# Patient Record
Sex: Female | Born: 1950
Health system: Southern US, Community
[De-identification: ages and names within clinical notes are randomized; demographics above are authoritative.]

## PROBLEM LIST (undated history)

## (undated) DIAGNOSIS — I739 Peripheral vascular disease, unspecified: Secondary | ICD-10-CM

## (undated) DIAGNOSIS — I10 Essential (primary) hypertension: Secondary | ICD-10-CM

## (undated) DIAGNOSIS — E785 Hyperlipidemia, unspecified: Secondary | ICD-10-CM

## (undated) HISTORY — PX: OTHER SURGICAL HISTORY: SHX169

## (undated) HISTORY — DX: Peripheral vascular disease, unspecified: I73.9

## (undated) HISTORY — PX: PARTIAL HYSTERECTOMY: SHX80

## (undated) HISTORY — DX: Hyperlipidemia, unspecified: E78.5

---

## 1999-11-05 ENCOUNTER — Encounter: Payer: Self-pay | Admitting: Internal Medicine

## 1999-11-05 ENCOUNTER — Encounter: Admission: RE | Admit: 1999-11-05 | Discharge: 1999-11-05 | Payer: Self-pay | Admitting: Internal Medicine

## 2000-01-04 ENCOUNTER — Emergency Department (HOSPITAL_COMMUNITY): Admission: EM | Admit: 2000-01-04 | Discharge: 2000-01-04 | Payer: Self-pay | Admitting: Emergency Medicine

## 2000-11-22 ENCOUNTER — Encounter: Admission: RE | Admit: 2000-11-22 | Discharge: 2000-11-22 | Payer: Self-pay | Admitting: Internal Medicine

## 2000-11-22 ENCOUNTER — Encounter: Payer: Self-pay | Admitting: Internal Medicine

## 2002-03-15 ENCOUNTER — Encounter: Payer: Self-pay | Admitting: Internal Medicine

## 2002-03-15 ENCOUNTER — Encounter: Admission: RE | Admit: 2002-03-15 | Discharge: 2002-03-15 | Payer: Self-pay | Admitting: Internal Medicine

## 2003-02-21 ENCOUNTER — Encounter: Payer: Self-pay | Admitting: Internal Medicine

## 2003-02-21 ENCOUNTER — Encounter: Admission: RE | Admit: 2003-02-21 | Discharge: 2003-02-21 | Payer: Self-pay | Admitting: Internal Medicine

## 2003-06-11 ENCOUNTER — Ambulatory Visit (HOSPITAL_COMMUNITY): Admission: RE | Admit: 2003-06-11 | Discharge: 2003-06-11 | Payer: Self-pay | Admitting: Gastroenterology

## 2004-02-25 ENCOUNTER — Ambulatory Visit (HOSPITAL_COMMUNITY): Admission: RE | Admit: 2004-02-25 | Discharge: 2004-02-25 | Payer: Self-pay | Admitting: Internal Medicine

## 2004-02-25 ENCOUNTER — Encounter: Admission: RE | Admit: 2004-02-25 | Discharge: 2004-02-25 | Payer: Self-pay | Admitting: Internal Medicine

## 2005-04-21 ENCOUNTER — Ambulatory Visit (HOSPITAL_COMMUNITY): Admission: RE | Admit: 2005-04-21 | Discharge: 2005-04-21 | Payer: Self-pay | Admitting: Internal Medicine

## 2005-06-08 ENCOUNTER — Ambulatory Visit (HOSPITAL_COMMUNITY): Admission: RE | Admit: 2005-06-08 | Discharge: 2005-06-08 | Payer: Self-pay | Admitting: Vascular Surgery

## 2005-06-22 ENCOUNTER — Ambulatory Visit (HOSPITAL_COMMUNITY): Admission: RE | Admit: 2005-06-22 | Discharge: 2005-06-22 | Payer: Self-pay | Admitting: Vascular Surgery

## 2006-04-23 ENCOUNTER — Ambulatory Visit (HOSPITAL_COMMUNITY): Admission: RE | Admit: 2006-04-23 | Discharge: 2006-04-23 | Payer: Self-pay | Admitting: Internal Medicine

## 2006-10-13 ENCOUNTER — Ambulatory Visit: Payer: Self-pay | Admitting: Vascular Surgery

## 2007-04-26 ENCOUNTER — Ambulatory Visit (HOSPITAL_COMMUNITY): Admission: RE | Admit: 2007-04-26 | Discharge: 2007-04-26 | Payer: Self-pay | Admitting: Internal Medicine

## 2007-05-02 ENCOUNTER — Encounter: Admission: RE | Admit: 2007-05-02 | Discharge: 2007-05-02 | Payer: Self-pay | Admitting: Internal Medicine

## 2008-04-17 ENCOUNTER — Ambulatory Visit: Payer: Self-pay | Admitting: Vascular Surgery

## 2008-05-03 ENCOUNTER — Ambulatory Visit (HOSPITAL_COMMUNITY): Admission: RE | Admit: 2008-05-03 | Discharge: 2008-05-03 | Payer: Self-pay | Admitting: Internal Medicine

## 2008-05-07 ENCOUNTER — Ambulatory Visit: Payer: Self-pay | Admitting: Vascular Surgery

## 2008-05-07 ENCOUNTER — Ambulatory Visit (HOSPITAL_COMMUNITY): Admission: RE | Admit: 2008-05-07 | Discharge: 2008-05-07 | Payer: Self-pay | Admitting: Vascular Surgery

## 2008-05-07 HISTORY — PX: OTHER SURGICAL HISTORY: SHX169

## 2008-08-27 ENCOUNTER — Emergency Department (HOSPITAL_COMMUNITY): Admission: EM | Admit: 2008-08-27 | Discharge: 2008-08-27 | Payer: Self-pay | Admitting: Emergency Medicine

## 2008-12-27 ENCOUNTER — Ambulatory Visit: Payer: Self-pay | Admitting: Vascular Surgery

## 2009-02-07 ENCOUNTER — Ambulatory Visit: Payer: Self-pay | Admitting: Vascular Surgery

## 2009-05-07 ENCOUNTER — Ambulatory Visit (HOSPITAL_COMMUNITY): Admission: RE | Admit: 2009-05-07 | Discharge: 2009-05-07 | Payer: Self-pay | Admitting: Internal Medicine

## 2010-02-26 ENCOUNTER — Ambulatory Visit: Payer: Self-pay | Admitting: Vascular Surgery

## 2010-06-14 ENCOUNTER — Encounter: Payer: Self-pay | Admitting: Internal Medicine

## 2010-06-17 ENCOUNTER — Other Ambulatory Visit (HOSPITAL_COMMUNITY): Payer: Self-pay | Admitting: Internal Medicine

## 2010-06-17 DIAGNOSIS — Z1239 Encounter for other screening for malignant neoplasm of breast: Secondary | ICD-10-CM

## 2010-06-17 DIAGNOSIS — Z1231 Encounter for screening mammogram for malignant neoplasm of breast: Secondary | ICD-10-CM

## 2010-07-08 ENCOUNTER — Ambulatory Visit (HOSPITAL_COMMUNITY): Payer: Self-pay

## 2010-07-10 ENCOUNTER — Ambulatory Visit (HOSPITAL_COMMUNITY)
Admission: RE | Admit: 2010-07-10 | Discharge: 2010-07-10 | Disposition: A | Discharge status: Home / Self Care | Payer: BC Managed Care – PPO | Source: Ambulatory Visit | Attending: Internal Medicine | Admitting: Internal Medicine

## 2010-07-10 DIAGNOSIS — Z1231 Encounter for screening mammogram for malignant neoplasm of breast: Secondary | ICD-10-CM

## 2010-07-30 ENCOUNTER — Ambulatory Visit (INDEPENDENT_AMBULATORY_CARE_PROVIDER_SITE_OTHER): Payer: BC Managed Care – PPO | Admitting: Vascular Surgery

## 2010-07-30 DIAGNOSIS — I70219 Atherosclerosis of native arteries of extremities with intermittent claudication, unspecified extremity: Secondary | ICD-10-CM

## 2010-07-31 NOTE — Assessment & Plan Note (Signed)
OFFICE VISIT  Heather Hart, Heather Hart DOB:  04-23-1951                                       07/30/2010 [id number]#:[id number]  I saw the patient in the office today for continued follow-up of her peripheral vascular disease.  This is a pleasant 60 year old woman who underwent PTA and stenting of the right common iliac artery in December of 2009.  She also has a known  left common iliac artery occlusion.  I last saw her in September of 2010 at which time, her stent was widely patent and she was doing well.  We planned on seeing her back in a year; however, she was then lost to follow-up.  She denies any claudication or rest pain in the right leg.  She has had some stable claudication in the left thigh which has not changed over the last year.  She has had no rest pain on the left and no history of nonhealing ulcers.  Her symptoms in the left thigh are brought on by ambulation and relieved with rest. There are no other aggravating or alleviating factors.  SOCIAL HISTORY:  She does not use tobacco.  REVIEW OF SYSTEMS:  CARDIOVASCULAR:  She had no chest pain, chest pressure, palpitations or arrhythmias. PULMONARY:  She had no productive cough,  bronchitis, asthma or wheezing.  PHYSICAL EXAMINATION:  This is a pleasant 60 year old woman who appears her stated age.  Blood pressure 154/88, heart rate is 76, saturation 99%.  Lungs:  Clear bilaterally to auscultation.  Cardiovascular exam: I do not detect any carotid bruits.  She has a regular rate and rhythm. It is somewhat difficult to palpate her right femoral pulse because of her size.  I cannot palpate a left femoral pulse.  She does have a palpable posterior tibial pulse in the right foot.  I cannot palpate pedal pulses on the left foot.  Abdomen:  Soft and nontender with normal pitched bowel sounds.  Neurologic:  Shows no focal weakness or paresthesias.  I did independently interpret her arterial Doppler study  today which shows an ABI of 87% on the right and 66% on the left.  I have compared this to her previous study from September 2010 at which time she was 99% on the right and 58% on the left.  I did also interpret her duplex of her stent which was also done on 02/26/2010 and shows that her right common iliac artery stent is widely patent with no areas of increased velocity.  Her drop in ABI is likely related to some infrainguinal arterial occlusive disease.  She remains asymptomatic on the right side  and I think her stent on the right is patent.  She tries to ambulate every day and she is not a smoker.  She had been on Plavix but  this has subsequently been discontinued which I think is reasonable given that the stent has been in since 2009.  I have encouraged to stay as active as possible.  I have ordered follow-up studies in 1 year.  I will plan on seeing her back in 2 years unless there is any  change in her follow-up studies or if she develops any new symptoms.    Di Kindle. Edilia Bo, M.D. Electronically Signed  CSD/MEDQ  D:  07/30/2010  T:  07/31/2010  Job:  [id number]  cc:   Theressa Millard, M.D.

## 2010-10-07 NOTE — Op Note (Signed)
Heather Hart, Heather Hart             ACCOUNT NO.:  [id number]   MEDICAL RECORD NO.:  [id number]          PATIENT TYPE:  AMB   LOCATION:                               FACILITY:  [facility]   PHYSICIAN:  Di Kindle. Edilia Bo, M.D.DATE OF BIRTH:  06-11-1950   DATE OF PROCEDURE:  05/07/2008  DATE OF DISCHARGE:                               OPERATIVE REPORT   PREOPERATIVE DIAGNOSIS:  Bilateral iliac artery occlusive disease.   POSTOPERATIVE DIAGNOSIS:  Bilateral iliac artery occlusive disease.   PROCEDURE:  1. Ultrasound-guided access to the right common femoral artery.  2. Aortogram with bilateral runoff.  3. Percutaneous transluminal angioplasty and stent to the right common      iliac artery, a Genesis [id number] stent.   DESCRIPTION OF PROCEDURE:  The patient was taken to the PV Lab and  sedated with a milligram of Versed and 50 mcg of fentanyl IV.  Both  groins were prepped and draped in the usual sterile fashion.  After the  skin was infiltrated with 1% lidocaine and under ultrasound guidance,  the right common femoral artery was cannulated and a guidewire  introduced into the infrarenal aorta under fluoroscopic control.  The 5-  French sheath was introduced over the wire.  A pigtail catheter was  positioned at the L1 vertebral body and fresh aortogram obtained.  The  catheter was then repositioned above the aortic bifurcation and oblique  iliac projections were obtained.  There was an approximately 85%  irregular plaque in the proximal right common iliac artery.  The left  common iliac artery had a long segment occlusion reconstitution via the  hypogastric, via left external iliac artery.  I elected to address the  right iliac artery stenosis in anticipation of possible right-to-left  fem-fem bypass grafting.  The 5-French sheath was exchanged for a long 6-  French sheath and the patient then received 4000 units of IV heparin.  The sheath was then advanced through the stenosis and then  a Genesis [id number]  [id number] stent was positioned across the stenosis and the sheath retracted.  The stent was deployed.  I did a poststenotic dilatation with a 9-mm  balloon after completion film after the initial stent showed some mild  residual stenosis.  At the completion, there was an excellent result  with no residual stenosis.  The gradient was measured across this area  and there was no resting gradient.  Bilateral lower extremity runoff  films were then obtained.   FINDINGS:  There are single renal arteries bilaterally with no  significant renal artery stenosis identified.  The infrarenal aorta is  widely patent.  On the left side, the common iliac artery is occluded at  its origin.  There is reconstitution in the external iliac artery via  hypogastric artery collaterals.  The common femoral, deep femoral,  superficial femoral, popliteal and tibial vessels are patent on the  left.  This 3-vessel runoff on the left via the anterior tibial,  posterior tibial, and peroneal arteries.   On the right side, there is a proximal 85% irregular plaque in the  common iliac artery.  This was successfully ballooned and stented as  described above.  Distally, the common iliac, hypogastric, and external  iliac arteries are patent on the right.  On the right, the common  femoral, deep femoral, superficial femoral, popliteal and tibial vessels  were all patent on the right with three-vessel runoff on the right via  the anterior tibial, posterior tibial, and peroneal arteries.   CONCLUSIONS:  1. Long segment occlusion of left common iliac artery.  2. 85% stenosis in the proximal right common iliac artery, which was      successfully ballooned and stented.  3. Minimal infrainguinal arterial occlusive disease.      Di Kindle. Edilia Bo, M.D.  Electronically Signed     CSD/MEDQ  D:  05/07/2008  T:  05/07/2008  Job:  [id number]   cc:   Theressa Millard, M.D.

## 2010-10-07 NOTE — Assessment & Plan Note (Signed)
OFFICE VISIT   Heather Hart, Heather Hart  DOB:  1951/03/12                                       04/17/2008  XBMWU#:[id number]   I saw the patient in the office today for continued followup of her  iliac artery occlusive disease.  This is a pleasant 60 year old woman  who I had last seen in May of 2008 with left lower extremity  claudication.  She apparently had undergone an arteriogram in January of  2007 which showed an occlusion of the proximal left common iliac artery  and reconstitution of the external iliac artery via hypogastric  collaterals.  She had mild iliac disease on the right.   Since I saw her last her left lower extremity claudication has gradually  progressed.  She experiences claudication in the left thigh and hip  associated with ambulation and relieved with rest.  This occurs at a  fairly short distance of approximately 10 yards.  She has had no  significant right lower extremity symptoms.   Of note, she has gained weight and she thinks some of her progression of  symptoms may be related to this.  She has had no rest pain and no  history of nonhealing ulcers.   PAST MEDICAL HISTORY:  1. Her past medical history is significant for obesity.  2. Hypertension.  3. Hypercholesterolemia.  4. She denies any history of diabetes, history of previous myocardial      infarction, history of congestive heart failure or history of COPD.   FAMILY HISTORY:  She had a brother who had a myocardial infarction in  his 89s.  She is unaware of any other history of premature  cardiovascular disease.   SOCIAL HISTORY:  She is single.  She has two children.  She quit tobacco  10 years ago.   REVIEW OF SYSTEMS AND MEDICATIONS:  Are documented on the medical  history form in her chart.   PHYSICAL EXAMINATION:  General:  On physical examination this is a  pleasant 60 year old woman who appears her stated age.  She is  moderately obese.  Vital signs:  Her blood  pressure is 162/87, heart  rate is 84.  Neck:  I do not detect any carotid bruits.  Lungs:  Are  clear bilaterally to auscultation.  Cardiac:  She has a regular rate and  rhythm.  Abdomen:  Obese and difficult to assess.  She has a palpable  right femoral pulse.  I cannot palpate a left femoral pulse.  I cannot  palpate pedal pulses although both feet appear adequately perfused  without ischemic ulcers.  She has no significant lower extremity  swelling.   I have discussed the option of continued conservative treatment with a  structured walking program and I have explained that I would favor this  approach if her symptoms are tolerable.  Certainly there is significant  increased risk of surgery given her obesity.  We will angioplasty if  possible.  We have discussed the indications for arteriography and the  potential complications including but not limited to renal  insufficiency, arterial injury and bleeding.  We have also discussed the  potential complications of iliac angioplasty including but not limited  to bleeding, arterial thrombosis and arterial injury.  All of her  questions were answered and she is agreeable to proceed.  Her procedure  has been scheduled  for 05/07/2008.  Of note, she is currently not on  aspirin and I have asked her to begin taking enteric coated aspirin  daily.  If she undergoes iliac angioplasty we will put her on Plavix.  My thought is that if the right iliac stenosis has progressed she could  potentially require right iliac artery angioplasty in anticipation of a  right to left fem-fem bypass graft.  There is a small chance that the  left common iliac artery occlusion could potentially be addressed from  an endovascular standpoint although I do not think this was an ideal  lesion on her previous study.   Di Kindle. Edilia Bo, M.D.  Electronically Signed   CSD/MEDQ  D:  04/17/2008  T:  04/18/2008  Job:  [id number]

## 2010-10-07 NOTE — Assessment & Plan Note (Signed)
OFFICE VISIT   Heather Hart, [name]  DOB:  [birth year]                                       02/07/2009  YNWGN#:[id number]   I saw the patient in the office today for continued followup of her  iliac artery occlusive disease.  She has a known occlusion of the  proximal left common iliac artery and most recently she underwent PTA  and stenting of the right common iliac artery stenosis in December of  2009.  She comes in for a routine followup visit.  She states that the  claudication symptoms in her right leg have resolved.  She continues to  have some claudication on the left but she has been on a structured  walking program and these symptoms have been gradually improving.  She  does not use tobacco.  She has had no history of rest pain and no  history of nonhealing ulcers.  Claudication symptoms on the left are  mostly in the calf and thigh and are brought on by ambulation and  relieved with rest.  There are no other aggravating or alleviating factors.   REVIEW OF SYSTEMS:  She has had no recent chest pain, chest pressure,  palpitations or arrhythmias.  She has had no productive cough,  bronchitis, asthma or wheezing.   PHYSICAL EXAMINATION:  This is a pleasant 60 year old woman who appears  her stated age.  Blood pressure is 124/77, heart rate is 76.  Neck is  supple.  I do not detect any carotid bruits.  The lungs are clear  bilaterally to auscultation.  On cardiac exam she has a regular rate and  rhythm.  She has a palpable right femoral pulse.  I cannot palpate a  left femoral pulse.  She has a palpable posterior tibial pulse on the  right with no palpable pulses on the left.  She has triphasic Doppler  signals in the right foot with an ABI of 99%.  On the left side she has  monophasic signal with an ABI of 58%.   I think she has had an excellent result with her right iliac PTA and  stenting.  On the left side she had a segmental occlusion and was not  a  candidate for endovascular stent.  However, she has been able to walk  more now and I think her symptoms on the left have been improving.  Overall I am pleased with her progress and I will see her back in 1 year  with followup ABIs.  She knows to call sooner if she has problems.   Di Kindle. Edilia Bo, M.D.  Electronically Signed   CSD/MEDQ  D:  02/07/2009  T:  02/08/2009  Job:  [id number]   cc:   Theressa Millard, M.D.

## 2010-10-07 NOTE — Procedures (Signed)
LOWER EXTREMITY ARTERIAL DUPLEX   INDICATION:  PAD.   HISTORY:  Diabetes:  No  Cardiac:  No  Hypertension:  Yes.   SINGLE LEVEL ARTERIAL EXAM                          RIGHT                LEFT  Brachial:               178                  165  Anterior tibial:        145                  118  Posterior tibial:       155                  101  Peroneal:  Ankle/Brachial Index:   0.87                 0.66   LOWER EXTREMITY ARTERIAL DUPLEX EXAM   INDICATIONS:  Right common iliac artery stent.   EXAM:  Right common iliac artery duplex.   IMPRESSION:  Right common iliac artery stent appears patent with  velocity measurements attached.   ___________________________________________  Di Kindle. Edilia Bo, M.D.   EM/MEDQ  D:  02/26/2010  T:  02/26/2010  Job:  [id number]

## 2010-10-10 NOTE — Op Note (Signed)
NAME:  Heather Hart, Heather Hart                       ACCOUNT NO.:  [id number]   MEDICAL RECORD NO.:  [id number]                   PATIENT TYPE:  AMB   LOCATION:  ENDO                                 FACILITY:  [facility]   PHYSICIAN:  Danise Edge, M.D.                DATE OF BIRTH:  Sep 09, 1950   DATE OF PROCEDURE:  06/11/2003  DATE OF DISCHARGE:                                 OPERATIVE REPORT   PROCEDURE:  Screening colonoscopy.   INDICATIONS:  Mrs. Preeti Winegardner is a 60 year old female, born February 25, 1951.  Mrs. Coonrod is scheduled to undergo her first screening colonoscopy  with polypectomy to prevent colon cancer.   ENDOSCOPIST:  Danise Edge, M.D.   PREMEDICATION:  Versed 10 mg, Demerol 70 mg.   DESCRIPTION OF PROCEDURE:  After obtaining informed consent, Mrs. Huebert  was placed in the left lateral decubitus position.  I administered  intravenous Demerol and intravenous Versed to achieve conscious sedation for  the procedure.  The patient's blood pressure, oxygen saturation and cardiac  rhythm were monitored throughout the procedure and documented in the medical  record.   Anal inspection was normal.  Digital rectal exam was normal.  The Olympus  adjustable pediatric colonoscope was introduced into the rectum and advanced  to the cecum.  Colonic preparation for the exam today was excellent.  Rectum:  Normal.  Sigmoid colon and descending colon:  Normal.  Splenic flexure:  Normal.  Transverse colon:  Normal.  Hepatic flexure:  Normal.  Ascending colon:  Normal.  Cecum and ileocecal valve:  Normal.   ASSESSMENT:  Normal screening proctocolonoscopy to the cecum.  No endoscopic  evidence for the presence of colorectal endoscopic neoplasia.                                               Danise Edge, M.D.    MJ/MEDQ  D:  06/11/2003  T:  06/11/2003  Job:  [id number]   cc:   Theressa Millard, M.D.  [address] [place]  [place] [address]  Fax: [phone]

## 2010-10-10 NOTE — Op Note (Signed)
NAMEJALIA, Heather Hart             ACCOUNT NO.:  [id number]   MEDICAL RECORD NO.:  [id number]          PATIENT TYPE:  AMB   LOCATION:  SDS                          FACILITY:  [facility]   PHYSICIAN:  Di Kindle. Edilia Bo, M.D.DATE OF BIRTH:  15-Feb-1951   DATE OF PROCEDURE:  06/22/2005  DATE OF DISCHARGE:                                 OPERATIVE REPORT   PREOPERATIVE DIAGNOSIS:  Left iliac artery occlusive disease.   POSTOPERATIVE DIAGNOSIS:  Left iliac artery occlusion.   PROCEDURES:  1.  Aortogram.  2.  Bilateral iliac arteriogram.  3.  Bilateral lower extremity runoff.   SURGEON:  Edilia Bo.   ANESTHESIA:  Local with sedation.   TECHNIQUE:  The patient was taken to the PV lab at [facility] and sedated with a  milligram of Versed and 50 mcg of fentanyl. The patient later received an  additional milligram of Versed. Both groins were prepped and draped in usual  sterile fashion. There is no palpable left femoral pulse; however, using the  Doppler and after the skin was anesthetized, I was able to cannulate the  left common femoral artery. A 5-French sheath was then introduced over the  wire; however, I was unable to pass the wire into the infrarenal aorta  despite using an angled Glidewire and trying to direct the wire with an end-  hole catheter. Therefore I had what looked like there was an iliac artery  occlusion on the left and I had to cannulate the right groin. Again after  the skin was anesthetized, the right common femoral artery was cannulated  and a 5-French sheath introduced over the wire. I was able to get an angled  Glidewire up to the right iliac artery, although there was some difficulty  right at the origin where there appeared to be a plaque. The pigtail  catheter was positioned at the L1 vertebral body and flush aortogram  obtained. The catheter was repositioned above the aortic bifurcation and an  oblique iliac projection was obtained. The runoff films were obtained  through each of the femoral sheaths respectively.   FINDINGS:  There are single renal arteries bilaterally with no significant  renal artery stenosis identified. The infrarenal aorta is widely patent with  some mild eccentric plaque at the distal aorta and proximal right common  iliac artery. The right common iliac artery is otherwise widely patent as is  the hypogastric artery on the right and the external iliac artery. On the  left side, the left common iliac artery is occluded at its origin with  reconstitution via collaterals from the left hypogastric artery. The  external iliac artery is patent. Bilaterally the common femoral, superficial  femoral, deep femoral and popliteal arteries were patent bilaterally. The  tibial vessels are patent bilaterally with three-vessel runoff bilaterally,  although the tibials was somewhat small.   CONCLUSIONS:  1.  Left iliac artery occlusion with reconstitution at the level of the      bifurcation.  2.  Mild proximal right iliac artery stenosis with a 15 mmHg resting      gradient across the stenosis.  Di Kindle. Edilia Bo, M.D.  Electronically Signed     CSD/MEDQ  D:  06/22/2005  T:  06/22/2005  Job:  [id number]

## 2011-02-27 LAB — POCT I-STAT, CHEM 8
BUN: 13 mg/dL (ref 6–23)
Calcium, Ion: 1.18 mmol/L (ref 1.12–1.32)
Chloride: 106 mEq/L (ref 96–112)
Creatinine, Ser: 0.9 mg/dL (ref 0.4–1.2)
Glucose, Bld: 124 mg/dL — ABNORMAL HIGH (ref 70–99)
HCT: 40 % (ref 36.0–46.0)
Hemoglobin: 13.6 g/dL (ref 12.0–15.0)
Potassium: 3.1 mEq/L — ABNORMAL LOW (ref 3.5–5.1)
Sodium: 142 mEq/L (ref 135–145)
TCO2: 26 mmol/L (ref 0–100)

## 2011-03-11 ENCOUNTER — Other Ambulatory Visit (INDEPENDENT_AMBULATORY_CARE_PROVIDER_SITE_OTHER): Payer: BC Managed Care – PPO | Admitting: *Deleted

## 2011-03-11 ENCOUNTER — Ambulatory Visit (INDEPENDENT_AMBULATORY_CARE_PROVIDER_SITE_OTHER): Payer: BC Managed Care – PPO | Admitting: *Deleted

## 2011-03-11 DIAGNOSIS — Z48812 Encounter for surgical aftercare following surgery on the circulatory system: Secondary | ICD-10-CM

## 2011-03-11 DIAGNOSIS — I739 Peripheral vascular disease, unspecified: Secondary | ICD-10-CM

## 2011-03-18 ENCOUNTER — Other Ambulatory Visit: Payer: Self-pay

## 2011-03-26 ENCOUNTER — Encounter: Payer: Self-pay | Admitting: Vascular Surgery

## 2011-03-26 NOTE — Procedures (Unsigned)
VASCULAR LAB EXAM  INDICATION:  Follow up stent placement.  HISTORY: Diabetes:  No. Cardiac:  No. Hypertension:  Yes.  EXAM: 1. Duplex of the right common iliac artery stent, which was placed on     05/07/2008. 2. Known left common iliac artery occlusion.  IMPRESSION: 1. Patent right common iliac artery stent without evidence of     stenosis. 2. Right ankle brachial indices within normal limits. 3. Left ankle brachial index is suggestive of moderate arterial     disease.  ___________________________________________ Di Kindle. Edilia Bo, M.D.  EM/MEDQ  D:  03/11/2011  T:  03/11/2011  Job:  [id number]

## 2011-03-29 ENCOUNTER — Emergency Department (HOSPITAL_COMMUNITY): Payer: BC Managed Care – PPO

## 2011-03-29 ENCOUNTER — Emergency Department (HOSPITAL_COMMUNITY)
Admission: EM | Admit: 2011-03-29 | Discharge: 2011-03-29 | Disposition: A | Discharge status: Home / Self Care | Payer: BC Managed Care – PPO | Attending: Emergency Medicine | Admitting: Emergency Medicine

## 2011-03-29 DIAGNOSIS — I10 Essential (primary) hypertension: Secondary | ICD-10-CM | POA: Insufficient documentation

## 2011-03-29 DIAGNOSIS — Z7982 Long term (current) use of aspirin: Secondary | ICD-10-CM | POA: Insufficient documentation

## 2011-03-29 DIAGNOSIS — S0990XA Unspecified injury of head, initial encounter: Secondary | ICD-10-CM | POA: Insufficient documentation

## 2011-03-29 DIAGNOSIS — R110 Nausea: Secondary | ICD-10-CM | POA: Insufficient documentation

## 2011-03-29 DIAGNOSIS — R220 Localized swelling, mass and lump, head: Secondary | ICD-10-CM | POA: Insufficient documentation

## 2011-03-29 DIAGNOSIS — E119 Type 2 diabetes mellitus without complications: Secondary | ICD-10-CM | POA: Insufficient documentation

## 2011-03-29 DIAGNOSIS — W19XXXA Unspecified fall, initial encounter: Secondary | ICD-10-CM

## 2011-03-29 DIAGNOSIS — Y92009 Unspecified place in unspecified non-institutional (private) residence as the place of occurrence of the external cause: Secondary | ICD-10-CM | POA: Insufficient documentation

## 2011-03-29 DIAGNOSIS — R51 Headache: Secondary | ICD-10-CM | POA: Insufficient documentation

## 2011-03-29 DIAGNOSIS — Z79899 Other long term (current) drug therapy: Secondary | ICD-10-CM | POA: Insufficient documentation

## 2011-03-29 HISTORY — DX: Essential (primary) hypertension: I10

## 2011-03-29 MED ORDER — IBUPROFEN 800 MG PO TABS: 800.0000 mg | ORAL_TABLET | Freq: Once | ORAL | Status: DC

## 2011-03-29 MED ORDER — IBUPROFEN 800 MG PO TABS
ORAL_TABLET | ORAL | Status: AC
  Administered 2011-03-29: 17:00:00
  Filled 2011-03-29: qty 1

## 2011-03-29 NOTE — ED Provider Notes (Signed)
History     CSN: [id number] Arrival date & time: 03/29/2011  1:46 PM   First MD Initiated Contact with Patient 03/29/11 1508      Chief Complaint  Patient presents with  . Fall    pt in from home with c/o head injury states onset 1 hr ago pt states fell while getting in her truck hitting the back of head denies loc states pain in head 10/10 with nausea palpable knot noted to the back of head skin intacr    (Consider location/radiation/quality/duration/timing/severity/associated sxs/prior treatment) HPI She presents immediately after a mech fall w persistent occipital pain.  She slipped while getting into her vehicle and struck her head and back against the pavement.  No LOC, no emesis, no visual changes, no ataxia, no behavioral changes. No attempts at analgesia, though the pain has improved minimally spontaneously.  She now c/o pain (non-radiating, sharp) in the occiput and tightness about the R superior shoulder. No extremity n/w/t, or loss of function.  No CP/dyspnea, n/v   Past Medical History  Diagnosis Date  . Hypertension   . Diabetes mellitus     History reviewed. No pertinent past surgical history.  No family history on file.  History  Substance Use Topics  . Smoking status: Not on file  . Smokeless tobacco: Not on file  . Alcohol Use: No    OB History    Grav Para Term Preterm Abortions TAB SAB Ect Mult Living                  Review of Systems  All other systems reviewed and are negative.    Allergies  Review of patient's allergies indicates no known allergies.  Home Medications   Current Outpatient Rx  Name Route Sig Dispense Refill  . ASPIRIN EC 81 MG PO TBEC Oral Take 81 mg by mouth daily.      . BUPROPION HCL ER (SMOKING DET) 150 MG PO TB12 Oral Take 150 mg by mouth 2 (two) times daily.      Marland Kitchen CALCIUM CARBONATE-VITAMIN D 600-200 MG-UNIT PO TABS Oral Take 1 tablet by mouth 2 (two) times daily.      Marland Kitchen HYDROCHLOROTHIAZIDE 25 MG PO TABS Oral Take  25 mg by mouth daily.      Marland Kitchen RAMIPRIL 10 MG PO CAPS Oral Take 10 mg by mouth daily.      Marland Kitchen SIMVASTATIN 10 MG PO TABS Oral Take 10 mg by mouth at bedtime.        BP 186/95  Pulse 87  Temp(Src) 98 F (36.7 C) (Oral)  Resp 16  SpO2 95%  Physical Exam  Constitutional: She is oriented to person, place, and time. She appears well-developed and well-nourished.  HENT:  Head: Normocephalic.       Palpable hematoma on R-occiput w/o notable abrasion / laceration or boney defect.  Eyes: Conjunctivae and EOM are normal. Pupils are equal, round, and reactive to light.  Neck: Normal range of motion. Neck supple. No tracheal deviation present. No thyromegaly present.  Cardiovascular: Normal rate and regular rhythm.   Pulmonary/Chest: Effort normal and breath sounds normal. No stridor.  Abdominal: She exhibits no distension.  Musculoskeletal: She exhibits no edema and no tenderness.  Lymphadenopathy:    She has no cervical adenopathy.  Neurological: She is alert and oriented to person, place, and time.  Skin: Skin is warm and dry.    ED Course  Procedures (including critical care time)  Labs Reviewed - No data  to display No results found.   No diagnosis found.  CT: No acute findings.  MDM  This 60yo F presents following a mech fall w head pain.  No notable CT findings.  Patient will be d/c w analgesia, instructions to call her PMD tomorrow for check in.        Gerhard Munch, MD 03/29/11 1610

## 2011-03-29 NOTE — ED Notes (Signed)
Patient states she was trying to enter her vechile and slipped and fell straight back on concrete and hitting the back of her  head. Present s head pain rt shoulder pain

## 2011-07-31 ENCOUNTER — Other Ambulatory Visit (HOSPITAL_COMMUNITY): Payer: Self-pay | Admitting: Internal Medicine

## 2011-07-31 DIAGNOSIS — Z1231 Encounter for screening mammogram for malignant neoplasm of breast: Secondary | ICD-10-CM

## 2011-08-26 ENCOUNTER — Ambulatory Visit (HOSPITAL_COMMUNITY)
Admission: RE | Admit: 2011-08-26 | Discharge: 2011-08-26 | Disposition: A | Discharge status: Home / Self Care | Payer: BC Managed Care – PPO | Source: Ambulatory Visit | Attending: Internal Medicine | Admitting: Internal Medicine

## 2011-08-26 DIAGNOSIS — Z1231 Encounter for screening mammogram for malignant neoplasm of breast: Secondary | ICD-10-CM | POA: Insufficient documentation

## 2011-11-27 ENCOUNTER — Encounter: Payer: Self-pay | Admitting: Vascular Surgery

## 2012-03-18 ENCOUNTER — Other Ambulatory Visit: Payer: Self-pay | Admitting: *Deleted

## 2012-03-18 DIAGNOSIS — Z48812 Encounter for surgical aftercare following surgery on the circulatory system: Secondary | ICD-10-CM

## 2012-03-18 DIAGNOSIS — I739 Peripheral vascular disease, unspecified: Secondary | ICD-10-CM

## 2012-03-23 ENCOUNTER — Ambulatory Visit: Payer: BC Managed Care – PPO | Admitting: Vascular Surgery

## 2012-03-23 ENCOUNTER — Other Ambulatory Visit: Payer: BC Managed Care – PPO

## 2012-03-23 ENCOUNTER — Ambulatory Visit: Payer: BC Managed Care – PPO | Admitting: Neurosurgery

## 2012-03-24 ENCOUNTER — Encounter: Payer: Self-pay | Admitting: Neurosurgery

## 2012-03-25 ENCOUNTER — Ambulatory Visit: Payer: BC Managed Care – PPO | Admitting: Neurosurgery

## 2012-03-25 ENCOUNTER — Other Ambulatory Visit: Payer: BC Managed Care – PPO

## 2012-05-24 ENCOUNTER — Encounter: Payer: Self-pay | Admitting: Neurosurgery

## 2012-05-26 ENCOUNTER — Ambulatory Visit (INDEPENDENT_AMBULATORY_CARE_PROVIDER_SITE_OTHER): Payer: BC Managed Care – PPO | Admitting: Neurosurgery

## 2012-05-26 ENCOUNTER — Encounter: Payer: Self-pay | Admitting: Neurosurgery

## 2012-05-26 ENCOUNTER — Encounter (INDEPENDENT_AMBULATORY_CARE_PROVIDER_SITE_OTHER): Payer: BC Managed Care – PPO | Admitting: *Deleted

## 2012-05-26 ENCOUNTER — Other Ambulatory Visit (INDEPENDENT_AMBULATORY_CARE_PROVIDER_SITE_OTHER): Payer: BC Managed Care – PPO | Admitting: *Deleted

## 2012-05-26 VITALS — BP 138/80 | HR 76 | Resp 20 | Ht 62.0 in | Wt 195.0 lb

## 2012-05-26 DIAGNOSIS — Z48812 Encounter for surgical aftercare following surgery on the circulatory system: Secondary | ICD-10-CM

## 2012-05-26 DIAGNOSIS — I739 Peripheral vascular disease, unspecified: Secondary | ICD-10-CM

## 2012-05-26 NOTE — Progress Notes (Signed)
[facility] [place] PAD/PVD Office Note  CC: PAD surveillance Referring Physician: Edilia Bo  History of Present Illness: 62 year old female patient of Dr. Edilia Bo status post right CIA stent in 2009. The patient denies any claudication, rest pain and has no open ulcerations on her lower extremities. The patient denies any new medical diagnoses or recent surgery.  Past Medical History  Diagnosis Date  . Hypertension   . Diabetes mellitus   . Hyperlipidemia   . Peripheral vascular disease     ROS: [x]  Positive   [ ]  Denies    General: [ ]  Weight loss, [ ]  Fever, [ ]  chills Neurologic: [ ]  Dizziness, [ ]  Blackouts, [ ]  Seizure [ ]  Stroke, [ ]  "Mini stroke", [ ]  Slurred speech, [ ]  Temporary blindness; [ ]  weakness in arms or legs, [ ]  Hoarseness Cardiac: [ ]  Chest pain/pressure, [ ]  Shortness of breath at rest [ ]  Shortness of breath with exertion, [ ]  Atrial fibrillation or irregular heartbeat Vascular: [ ]  Pain in legs with walking, [ ]  Pain in legs at rest, [ ]  Pain in legs at night,  [ ]  Non-healing ulcer, [ ]  Blood clot in vein/DVT,   Pulmonary: [ ]  Home oxygen, [ ]  Productive cough, [ ]  Coughing up blood, [ ]  Asthma,  [ ]  Wheezing Musculoskeletal:  [ ]  Arthritis, [ ]  Low back pain, [ ]  Joint pain Hematologic: [ ]  Easy Bruising, [ ]  Anemia; [ ]  Hepatitis Gastrointestinal: [ ]  Blood in stool, [ ]  Gastroesophageal Reflux/heartburn, [ ]  Trouble swallowing Urinary: [ ]  chronic Kidney disease, [ ]  on HD - [ ]  MWF or [ ]  TTHS, [ ]  Burning with urination, [ ]  Difficulty urinating Skin: [ ]  Rashes, [ ]  Wounds Psychological: [ ]  Anxiety, [ ]  Depression   Social History History  Substance Use Topics  . Smoking status: Former Smoker    Types: Cigarettes    Quit date: 11/26/1996  . Smokeless tobacco: Not on file  . Alcohol Use: No    Family History Family History  Problem Relation Age of Onset  . Heart attack Brother     No Known Allergies  Current  Outpatient Prescriptions  Medication Sig Dispense Refill  . ALPRAZolam (XANAX) 1 MG tablet Take 1 mg by mouth at bedtime as needed.      Marland Kitchen amLODipine-benazepril (LOTREL) 5-20 MG per capsule Take 1 capsule by mouth daily.      Marland Kitchen aspirin EC 81 MG tablet Take 81 mg by mouth daily.        . Calcium Carbonate-Vitamin D (CALCIUM + D) 600-200 MG-UNIT TABS Take 1 tablet by mouth 2 (two) times daily.        . hydrochlorothiazide (HYDRODIURIL) 25 MG tablet Take 25 mg by mouth daily.        Marland Kitchen buPROPion (ZYBAN) 150 MG 12 hr tablet Take 150 mg by mouth 2 (two) times daily.        . pravastatin (PRAVACHOL) 40 MG tablet Take 40 mg by mouth daily.      . ramipril (ALTACE) 10 MG capsule Take 10 mg by mouth daily.        . simvastatin (ZOCOR) 10 MG tablet Take 10 mg by mouth at bedtime.          Physical Examination  Filed Vitals:   05/26/12 1016  BP: 138/80  Pulse: 76  Resp: 20    Body mass index is 35.67 kg/(m^2).  General:  WDWN in NAD Gait:  Normal HEENT: WNL Eyes: Pupils equal Pulmonary: normal non-labored breathing , without Rales, rhonchi,  wheezing Cardiac: RRR, without  Murmurs, rubs or gallops; No carotid bruits Abdomen: soft, NT, no masses Skin: no rashes, ulcers noted Vascular Exam/Pulses: Palpable femoral pulses bilaterally, lower extremities are well-perfused  Extremities without ischemic changes, no Gangrene , no cellulitis; no open wounds;  Musculoskeletal: no muscle wasting or atrophy  Neurologic: A&O X 3; Appropriate Affect ; SENSATION: normal; MOTOR FUNCTION:  moving all extremities equally. Speech is fluent/normal  Non-Invasive Vascular Imaging: ABIs today are 0.93 and biphasic on the right, 0.70 on the left which is consistent with previous exam 14 months ago  ASSESSMENT/PLAN: Asymptomatic patient will followup in one year with repeat duplex. The patient's questions were encouraged and answered, she is in agreement with this plan.  Lauree Chandler ANP  Clinic M.D.:  [facility]

## 2012-05-26 NOTE — Addendum Note (Signed)
Addended by: Sharee Pimple on: 05/26/2012 03:48 PM   Modules accepted: Orders

## 2012-06-16 ENCOUNTER — Other Ambulatory Visit: Payer: Self-pay

## 2012-06-16 DIAGNOSIS — Z48812 Encounter for surgical aftercare following surgery on the circulatory system: Secondary | ICD-10-CM

## 2012-06-16 DIAGNOSIS — I739 Peripheral vascular disease, unspecified: Secondary | ICD-10-CM

## 2012-08-29 ENCOUNTER — Other Ambulatory Visit (HOSPITAL_COMMUNITY): Payer: Self-pay | Admitting: Internal Medicine

## 2012-08-29 DIAGNOSIS — Z1231 Encounter for screening mammogram for malignant neoplasm of breast: Secondary | ICD-10-CM

## 2012-09-08 ENCOUNTER — Ambulatory Visit (HOSPITAL_COMMUNITY): Payer: BC Managed Care – PPO

## 2012-09-20 ENCOUNTER — Ambulatory Visit (HOSPITAL_COMMUNITY): Payer: BC Managed Care – PPO

## 2012-11-03 ENCOUNTER — Ambulatory Visit (HOSPITAL_COMMUNITY)
Admission: RE | Admit: 2012-11-03 | Discharge: 2012-11-03 | Disposition: A | Discharge status: Home / Self Care | Payer: BC Managed Care – PPO | Source: Ambulatory Visit | Attending: Internal Medicine | Admitting: Internal Medicine

## 2012-11-03 DIAGNOSIS — Z1231 Encounter for screening mammogram for malignant neoplasm of breast: Secondary | ICD-10-CM | POA: Insufficient documentation

## 2013-05-23 ENCOUNTER — Encounter: Payer: Self-pay | Admitting: Family

## 2013-05-24 ENCOUNTER — Ambulatory Visit (INDEPENDENT_AMBULATORY_CARE_PROVIDER_SITE_OTHER)
Admission: RE | Admit: 2013-05-24 | Discharge: 2013-05-24 | Disposition: A | Discharge status: Home / Self Care | Payer: PRIVATE HEALTH INSURANCE | Source: Ambulatory Visit | Attending: Neurosurgery | Admitting: Neurosurgery

## 2013-05-24 ENCOUNTER — Encounter: Payer: Self-pay | Admitting: Family

## 2013-05-24 ENCOUNTER — Encounter (HOSPITAL_COMMUNITY): Payer: BC Managed Care – PPO

## 2013-05-24 ENCOUNTER — Ambulatory Visit: Payer: BC Managed Care – PPO | Admitting: Neurosurgery

## 2013-05-24 ENCOUNTER — Ambulatory Visit (INDEPENDENT_AMBULATORY_CARE_PROVIDER_SITE_OTHER): Payer: BC Managed Care – PPO | Admitting: Family

## 2013-05-24 ENCOUNTER — Other Ambulatory Visit: Payer: BC Managed Care – PPO

## 2013-05-24 ENCOUNTER — Other Ambulatory Visit (HOSPITAL_COMMUNITY): Payer: BC Managed Care – PPO

## 2013-05-24 ENCOUNTER — Encounter (INDEPENDENT_AMBULATORY_CARE_PROVIDER_SITE_OTHER): Payer: Self-pay

## 2013-05-24 ENCOUNTER — Ambulatory Visit (HOSPITAL_COMMUNITY)
Admission: RE | Admit: 2013-05-24 | Discharge: 2013-05-24 | Disposition: A | Discharge status: Home / Self Care | Payer: PRIVATE HEALTH INSURANCE | Source: Ambulatory Visit | Attending: Family | Admitting: Family

## 2013-05-24 VITALS — BP 175/89 | HR 64 | Resp 16 | Ht 61.5 in | Wt 187.0 lb

## 2013-05-24 DIAGNOSIS — I739 Peripheral vascular disease, unspecified: Secondary | ICD-10-CM | POA: Insufficient documentation

## 2013-05-24 DIAGNOSIS — Z48812 Encounter for surgical aftercare following surgery on the circulatory system: Secondary | ICD-10-CM

## 2013-05-24 NOTE — Progress Notes (Signed)
[facility] [place] HISTORY AND PHYSICAL -PAD  History of Present Illness Heather Hart is a 62 y.o. female patient of Dr. Edilia Bo status post right CIA stent in 2009.  She returns today for follow up. Standing for long periods of time or walking a block will incur left calf pain, which is relieved by rest. She denies claudication symptoms in left thigh or right leg; denies non-healing wounds, denies rest pain. She denies history of stroke or TIA symptoms, denies heart problems.  She has pushed herself to walk past the claudication pain and states her quality of life has improved a great deal and she can walk further. She rarely has left buttocks pain, only if she walks all day, denies right buttock pain.  Patient reports New Medical or Surgical History: her PCP started her on metformin for pre-diabetes.  Pt smoker: former smoker, quit 2009  Pt meds include: Statin :Yes ASA: Yes Other anticoagulants/antiplatelets: no  Past Medical History  Diagnosis Date  . Hypertension   . Diabetes mellitus   . Hyperlipidemia   . Peripheral vascular disease     Social History History  Substance Use Topics  . Smoking status: Former Smoker    Types: Cigarettes    Quit date: 11/26/1996  . Smokeless tobacco: Not on file  . Alcohol Use: No    Family History Family History  Problem Relation Age of Onset  . Heart attack Brother     Past Surgical History  Procedure Laterality Date  . Stenting of right common iliac artery   04/2008  . Aortogram  05/07/2008, 06/22/2005    No Known Allergies  Current Outpatient Prescriptions  Medication Sig Dispense Refill  . ALPRAZolam (XANAX) 1 MG tablet Take 1 mg by mouth at bedtime as needed.      Marland Kitchen amLODipine-benazepril (LOTREL) 5-20 MG per capsule Take 1 capsule by mouth daily.      Marland Kitchen aspirin EC 81 MG tablet Take 81 mg by mouth daily.        Marland Kitchen buPROPion (ZYBAN) 150 MG 12 hr tablet Take 150 mg by mouth 2 (two) times daily.         . Calcium Carbonate-Vitamin D (CALCIUM + D) 600-200 MG-UNIT TABS Take 1 tablet by mouth 2 (two) times daily.        . hydrochlorothiazide (HYDRODIURIL) 25 MG tablet Take 25 mg by mouth daily.        . pravastatin (PRAVACHOL) 40 MG tablet Take 40 mg by mouth daily.      . ramipril (ALTACE) 10 MG capsule Take 10 mg by mouth daily.        . simvastatin (ZOCOR) 10 MG tablet Take 10 mg by mouth at bedtime.         No current facility-administered medications for this visit.    ROS: See HPI for pertinent positives and negatives.   Physical Examination  Filed Vitals:   05/24/13 1105  BP: 175/89  Pulse: 64  Resp: 16   Filed Weights   05/24/13 1105  Weight: 187 lb (84.823 kg)   Body mass index is 34.77 kg/(m^2).  General: A&O x 3, WDWN, obese  Gait: normal Eyes: PERRLA, Pulmonary: CTAB, without wheezes , rales or rhonchi Cardiac: regular Rythm , without murmur          Carotid Bruits Left Right   Negative Negative  Aorta: is not palpable Radial pulses: are 2+ and =  VASCULAR EXAM: Extremities without ischemic changes  without Gangrene; without open wounds.                                                                                                          LE Pulses LEFT RIGHT       FEMORAL  not palpable   palpable        POPLITEAL   palpable    palpable       POSTERIOR TIBIAL  not palpable    palpable        DORSALIS PEDIS      ANTERIOR TIBIAL  palpable   palpable    Abdomen: soft, NT, no masses. Skin: no rashes, no ulcers noted. Musculoskeletal: no muscle wasting or atrophy.  Neurologic: A&O X 3; Appropriate Affect ; SENSATION: normal; MOTOR FUNCTION:  moving all extremities equally, motor strength 4/5 throughout. Speech is fluent/normal. CN 2-12 intact.  Non-Invasive Vascular Imaging: DATE: 05/24/2013 ABI: RIGHT 0.98, Waveforms: triphasic;  LEFT 0.66, Waveforms: monophasic Previous (05/26/12): Right: 0.93, Left: 0.70 DUPLEX  SCAN OF BYPASS: Suboptimal exam due to bowel gas and body habitus. Triphasic RT CFA suggests patency of stent. Monophasic LT CFA suggests proximal arterial disease.  ASSESSMENT: Heather Hart is a 62 y.o. female who presents for follow up status post right CIA stent in 2009. ABI's are stable with normal RLE and moderate arterial occlusive disease in LLE. She occasionally has LLE claudication, has no RLE claudication, is doing well with efforts at weight loss and exercise.   PLAN:  I discussed in depth with the patient the nature of atherosclerosis, and emphasized the importance of maximal medical management including strict control of blood pressure, blood glucose, and lipid levels, obtaining regular exercise, and continued cessation of smoking.  The patient is aware that without maximal medical management the underlying atherosclerotic disease process will progress, limiting the benefit of any interventions. Based on the patient's vascular studies and examination, pt will return to clinic in 1 year for ABI's and bilateral iliac artery Duplex stent evaluation.  The patient was given information about PAD including signs, symptoms, treatment, what symptoms should prompt the patient to seek immediate medical care, and risk reduction measures to take.  Charisse March, RN, MSN, FNP-C [facility] Phone: [phone]  Clinic MD: Edilia Bo  05/24/2013 9:30 AM

## 2013-05-24 NOTE — Patient Instructions (Signed)
Peripheral Vascular Disease Peripheral Vascular Disease (PVD), also called Peripheral Arterial Disease (PAD), is a circulation problem caused by cholesterol (atherosclerotic plaque) deposits in the arteries. PVD commonly occurs in the lower extremities (legs) but it can occur in other areas of the body, such as your arms. The cholesterol buildup in the arteries reduces blood flow which can cause pain and other serious problems. The presence of PVD can place a person at risk for Coronary Artery Disease (CAD).  CAUSES  Causes of PVD can be many. It is usually associated with more than one risk factor such as:   High Cholesterol.  Smoking.  Diabetes.  Lack of exercise or inactivity.  High blood pressure (hypertension).  Obesity.  Family history. SYMPTOMS   When the lower extremities are affected, patients with PVD may experience:  Leg pain with exertion or physical activity. This is called INTERMITTENT CLAUDICATION. This may present as cramping or numbness with physical activity. The location of the pain is associated with the level of blockage. For example, blockage at the abdominal level (distal abdominal aorta) may result in buttock or hip pain. Lower leg arterial blockage may result in calf pain.  As PVD becomes more severe, pain can develop with less physical activity.  In people with severe PVD, leg pain may occur at rest.  Other PVD signs and symptoms:  Leg numbness or weakness.  Coldness in the affected leg or foot, especially when compared to the other leg.  A change in leg color.  Patients with significant PVD are more prone to ulcers or sores on toes, feet or legs. These may take longer to heal or may reoccur. The ulcers or sores can become infected.  If signs and symptoms of PVD are ignored, gangrene may occur. This can result in the loss of toes or loss of an entire limb.  Not all leg pain is related to PVD. Other medical conditions can cause leg pain such  as:  Blood clots (embolism) or Deep Vein Thrombosis.  Inflammation of the blood vessels (vasculitis).  Spinal stenosis. DIAGNOSIS  Diagnosis of PVD can involve several different types of tests. These can include:  Pulse Volume Recording Method (PVR). This test is simple, painless and does not involve the use of X-rays. PVR involves measuring and comparing the blood pressure in the arms and legs. An ABI (Ankle-Brachial Index) is calculated. The normal ratio of blood pressures is 1. As this number becomes smaller, it indicates more severe disease.  < 0.95  indicates significant narrowing in one or more leg vessels.  <0.8 there will usually be pain in the foot, leg or buttock with exercise.  <0.4 will usually have pain in the legs at rest.  <0.25  usually indicates limb threatening PVD.  Doppler detection of pulses in the legs. This test is painless and checks to see if you have a pulses in your legs/feet.  A dye or contrast material (a substance that highlights the blood vessels so they show up on x-ray) may be given to help your caregiver better see the arteries for the following tests. The dye is eliminated from your body by the kidney's. Your caregiver may order blood work to check your kidney function and other laboratory values before the following tests are performed:  Magnetic Resonance Angiography (MRA). An MRA is a picture study of the blood vessels and arteries. The MRA machine uses a large magnet to produce images of the blood vessels.  Computed Tomography Angiography (CTA). A CTA is a   specialized x-ray that looks at how the blood flows in your blood vessels. An IV may be inserted into your arm so contrast dye can be injected.  Angiogram. Is a procedure that uses x-rays to look at your blood vessels. This procedure is minimally invasive, meaning a small incision (cut) is made in your groin. A small tube (catheter) is then inserted into the artery of your groin. The catheter is  guided to the blood vessel or artery your caregiver wants to examine. Contrast dye is injected into the catheter. X-rays are then taken of the blood vessel or artery. After the images are obtained, the catheter is taken out. TREATMENT  Treatment of PVD involves many interventions which may include:  Lifestyle changes:  Quitting smoking.  Exercise.  Following a low fat, low cholesterol diet.  Control of diabetes.  Foot care is very important to the PVD patient. Good foot care can help prevent infection.  Medication:  Cholesterol-lowering medicine.  Blood pressure medicine.  Anti-platelet drugs.  Certain medicines may reduce symptoms of Intermittent Claudication.  Interventional/Surgical options:  Angioplasty. An Angioplasty is a procedure that inflates a balloon in the blocked artery. This opens the blocked artery to improve blood flow.  Stent Implant. A wire mesh tube (stent) is placed in the artery. The stent expands and stays in place, allowing the artery to remain open.  Peripheral Bypass Surgery. This is a surgical procedure that reroutes the blood around a blocked artery to help improve blood flow. This type of procedure may be performed if Angioplasty or stent implants are not an option. SEEK IMMEDIATE MEDICAL CARE IF:   You develop pain or numbness in your arms or legs.  Your arm or leg turns cold, becomes blue in color.  You develop redness, warmth, swelling and pain in your arms or legs. MAKE SURE YOU:   Understand these instructions.  Will watch your condition.  Will get help right away if you are not doing well or get worse. Document Released: 06/18/2004 Document Revised: 08/03/2011 Document Reviewed: 05/15/2008 [employer] Patient Information 2014 [employer].  

## 2013-10-25 ENCOUNTER — Encounter: Payer: PRIVATE HEALTH INSURANCE | Attending: Internal Medicine

## 2013-10-25 VITALS — Ht 61.0 in | Wt 188.5 lb

## 2013-10-25 DIAGNOSIS — E119 Type 2 diabetes mellitus without complications: Secondary | ICD-10-CM | POA: Diagnosis not present

## 2013-10-25 DIAGNOSIS — Z713 Dietary counseling and surveillance: Secondary | ICD-10-CM | POA: Diagnosis not present

## 2013-10-25 NOTE — Progress Notes (Signed)
Patient was seen on 10/25/2013 for the first of a series of three diabetes self-management courses at the [facility].  Current HbA1c: 8.0%  The following learning objectives were met by the patient during this class:  Describe diabetes  State some common risk factors for diabetes  Defines the role of glucose and insulin  Identifies type of diabetes and pathophysiology  Describe the relationship between diabetes and cardiovascular risk  State the members of the Healthcare Team  States the rationale for glucose monitoring  State when to test glucose  State their individual Target Range  State the importance of logging glucose readings  Describe how to interpret glucose readings  Identifies A1C target  Explain the correlation between A1c and eAG values  State symptoms and treatment of high blood glucose  State symptoms and treatment of low blood glucose  Explain proper technique for glucose testing  Identifies proper sharps disposal  Handouts given during class include:  Living Well with Diabetes book  Carb Counting and Meal Planning book  Meal Plan Card  Carbohydrate guide  Meal planning worksheet  Low Sodium Flavoring Tips  The diabetes portion plate  V2N to eAG Conversion Chart  Diabetes Medications  Diabetes Recommended Care Schedule  Support Group  Diabetes Success Plan  Core Class Satisfaction Survey  Follow-Up Plan:  Attend core 2

## 2013-11-01 DIAGNOSIS — E119 Type 2 diabetes mellitus without complications: Secondary | ICD-10-CM

## 2013-11-01 NOTE — Progress Notes (Signed)

## 2013-11-02 ENCOUNTER — Ambulatory Visit: Payer: BC Managed Care – PPO

## 2014-05-08 ENCOUNTER — Other Ambulatory Visit (HOSPITAL_COMMUNITY): Payer: Self-pay | Admitting: Internal Medicine

## 2014-05-08 DIAGNOSIS — Z1231 Encounter for screening mammogram for malignant neoplasm of breast: Secondary | ICD-10-CM

## 2014-05-11 ENCOUNTER — Other Ambulatory Visit: Payer: Self-pay | Admitting: *Deleted

## 2014-05-11 DIAGNOSIS — I739 Peripheral vascular disease, unspecified: Secondary | ICD-10-CM

## 2014-05-11 DIAGNOSIS — Z48812 Encounter for surgical aftercare following surgery on the circulatory system: Secondary | ICD-10-CM

## 2014-05-15 ENCOUNTER — Ambulatory Visit (HOSPITAL_COMMUNITY)
Admission: RE | Admit: 2014-05-15 | Discharge: 2014-05-15 | Disposition: A | Discharge status: Home / Self Care | Payer: BC Managed Care – PPO | Source: Ambulatory Visit | Attending: Internal Medicine | Admitting: Internal Medicine

## 2014-05-15 DIAGNOSIS — Z1231 Encounter for screening mammogram for malignant neoplasm of breast: Secondary | ICD-10-CM | POA: Insufficient documentation

## 2014-05-23 ENCOUNTER — Other Ambulatory Visit (HOSPITAL_COMMUNITY): Payer: BC Managed Care – PPO

## 2014-05-23 ENCOUNTER — Encounter (HOSPITAL_COMMUNITY): Payer: BC Managed Care – PPO

## 2014-05-23 ENCOUNTER — Ambulatory Visit: Payer: BC Managed Care – PPO | Admitting: Family

## 2014-05-30 ENCOUNTER — Encounter (HOSPITAL_COMMUNITY): Payer: BC Managed Care – PPO

## 2014-05-30 ENCOUNTER — Other Ambulatory Visit (HOSPITAL_COMMUNITY): Payer: BC Managed Care – PPO

## 2014-05-30 ENCOUNTER — Ambulatory Visit: Payer: BC Managed Care – PPO | Admitting: Family

## 2014-06-06 ENCOUNTER — Encounter (HOSPITAL_COMMUNITY): Payer: BC Managed Care – PPO

## 2014-06-06 ENCOUNTER — Ambulatory Visit: Payer: BC Managed Care – PPO | Admitting: Family

## 2014-06-06 ENCOUNTER — Inpatient Hospital Stay (HOSPITAL_COMMUNITY)
Admission: RE | Admit: 2014-06-06 | Discharge: 2014-06-06 | Disposition: A | Discharge status: Home / Self Care | Payer: BC Managed Care – PPO | Source: Ambulatory Visit | Attending: Family | Admitting: Family

## 2014-06-06 DIAGNOSIS — I739 Peripheral vascular disease, unspecified: Secondary | ICD-10-CM

## 2014-06-06 DIAGNOSIS — Z48812 Encounter for surgical aftercare following surgery on the circulatory system: Secondary | ICD-10-CM

## 2014-06-08 ENCOUNTER — Encounter: Payer: Self-pay | Admitting: Family

## 2014-06-11 ENCOUNTER — Ambulatory Visit (INDEPENDENT_AMBULATORY_CARE_PROVIDER_SITE_OTHER): Payer: PRIVATE HEALTH INSURANCE | Admitting: Family

## 2014-06-11 ENCOUNTER — Other Ambulatory Visit: Payer: Self-pay | Admitting: Family

## 2014-06-11 ENCOUNTER — Ambulatory Visit (HOSPITAL_COMMUNITY)
Admission: RE | Admit: 2014-06-11 | Discharge: 2014-06-11 | Disposition: A | Discharge status: Home / Self Care | Payer: BC Managed Care – PPO | Source: Ambulatory Visit | Attending: Family | Admitting: Family

## 2014-06-11 ENCOUNTER — Encounter: Payer: Self-pay | Admitting: Family

## 2014-06-11 ENCOUNTER — Ambulatory Visit (INDEPENDENT_AMBULATORY_CARE_PROVIDER_SITE_OTHER)
Admission: RE | Admit: 2014-06-11 | Discharge: 2014-06-11 | Disposition: A | Discharge status: Home / Self Care | Payer: BC Managed Care – PPO | Source: Ambulatory Visit | Attending: Family | Admitting: Family

## 2014-06-11 VITALS — BP 154/95 | HR 73 | Resp 16 | Ht 61.0 in | Wt 183.0 lb

## 2014-06-11 DIAGNOSIS — E785 Hyperlipidemia, unspecified: Secondary | ICD-10-CM | POA: Diagnosis not present

## 2014-06-11 DIAGNOSIS — I10 Essential (primary) hypertension: Secondary | ICD-10-CM | POA: Insufficient documentation

## 2014-06-11 DIAGNOSIS — Z87891 Personal history of nicotine dependence: Secondary | ICD-10-CM | POA: Insufficient documentation

## 2014-06-11 DIAGNOSIS — Z48812 Encounter for surgical aftercare following surgery on the circulatory system: Secondary | ICD-10-CM | POA: Diagnosis not present

## 2014-06-11 DIAGNOSIS — Z9889 Other specified postprocedural states: Secondary | ICD-10-CM

## 2014-06-11 DIAGNOSIS — I739 Peripheral vascular disease, unspecified: Secondary | ICD-10-CM

## 2014-06-11 DIAGNOSIS — E119 Type 2 diabetes mellitus without complications: Secondary | ICD-10-CM | POA: Insufficient documentation

## 2014-06-11 DIAGNOSIS — I745 Embolism and thrombosis of iliac artery: Secondary | ICD-10-CM

## 2014-06-11 DIAGNOSIS — Z95828 Presence of other vascular implants and grafts: Secondary | ICD-10-CM

## 2014-06-11 NOTE — Patient Instructions (Addendum)

## 2014-06-11 NOTE — Progress Notes (Signed)
[facility] [place] HISTORY AND PHYSICAL -PAD  History of Present Illness Heather Hart is a 64 y.o. female  patient of Dr. Scot Dock who is status post right CIA stent in 2009 with a known left common iliac artery occlusion.  She returns today for follow up. At her last visit a year ago, standing for long periods of time or walking a block would incur left calf pain, but on a graduated walking program she can walk about 1.5 blocks before her left calf hurts, which is relieved by rest. She denies claudication symptoms in left thigh or right leg; denies non-healing wounds, denies rest pain.  She does report a rubbed dark area on the medial aspect of her right 5th toe, is tender to touch. She denies history of stroke or TIA symptoms, denies heart problems. , this is new since her last visit. She has pushed herself to walk past the claudication pain and states her quality of life has improved a great deal and she can walk further. She rarely has left buttocks pain, only if she walks all day, denies right buttock pain.  Patient reports New Medical or Surgical History: her PCP started her on metformin for pre-diabetes.  Pt smoker: former smoker, quit in 2009  Pt meds include: Statin :Yes ASA: Yes Other anticoagulants/antiplatelets: no   Past Medical History  Diagnosis Date  . Hypertension   . Hyperlipidemia   . Peripheral vascular disease   . Diabetes mellitus     Diet and exercise  . Peripheral arterial disease     Social History History  Substance Use Topics  . Smoking status: Former Smoker    Types: Cigarettes    Quit date: 11/26/1996  . Smokeless tobacco: Never Used  . Alcohol Use: No    Family History Family History  Problem Relation Age of Onset  . Heart attack Brother   . Cancer Brother   . Hypertension Brother   . Heart disease Brother     Heart Disease before age 21  . Hypertension Mother   . Stroke Mother   . Hypertension Father   .  Heart disease Father     Heart Disease before age 60  . Heart attack Father   . Cancer Sister   . Hypertension Son   . Stroke Maternal Grandmother   . Cancer Paternal Grandmother     Past Surgical History  Procedure Laterality Date  . Stenting of right common iliac artery   05/07/2008  . Aortogram  05/07/2008, 06/22/2005  . Cesarean section  1976  and 1979    No Known Allergies  Current Outpatient Prescriptions  Medication Sig Dispense Refill  . ALPRAZolam (XANAX) 1 MG tablet Take 1 mg by mouth at bedtime as needed.    Marland Kitchen amLODipine-benazepril (LOTREL) 5-20 MG per capsule Take 1 capsule by mouth daily.    Marland Kitchen aspirin EC 81 MG tablet Take 81 mg by mouth daily.      . Blood Glucose Monitoring Suppl (ACCU-CHEK AVIVA PLUS) W/DEVICE KIT   0  . buPROPion (ZYBAN) 150 MG 12 hr tablet Take 150 mg by mouth 2 (two) times daily.      . Calcium Carbonate-Vitamin D (CALCIUM + D) 600-200 MG-UNIT TABS Take 1 tablet by mouth 2 (two) times daily.      Marland Kitchen FLUVIRIN 0.5 ML SUSY   0  . hydrochlorothiazide (HYDRODIURIL) 25 MG tablet Take 25 mg by mouth daily.      . metFORMIN (GLUCOPHAGE) 500  MG tablet Take 500 mg by mouth 2 (two) times daily with a meal.    . pravastatin (PRAVACHOL) 40 MG tablet Take 40 mg by mouth daily.    . ramipril (ALTACE) 10 MG capsule Take 10 mg by mouth daily.      . rosuvastatin (CRESTOR) 20 MG tablet Take 20 mg by mouth daily.    . simvastatin (ZOCOR) 10 MG tablet Take 10 mg by mouth at bedtime.       No current facility-administered medications for this visit.    ROS: See HPI for pertinent positives and negatives.   Physical Examination  Filed Vitals:   06/11/14 1135  BP: 154/95  Pulse: 73  Resp: 16  Height: _0  (1.549 m)  Weight: 183 lb (83.008 kg)  SpO2: 99%   Body mass index is 34.6 kg/(m^2).  General: A&O x 3, WDWN, obese  Gait: normal Eyes: PERRLA, Pulmonary: CTAB, without wheezes , rales or rhonchi Cardiac: regular Rythm , without detected  murmur     Carotid Bruits Left Right   Negative Negative  Aorta: is not palpable Radial pulses: are 2+ and =   VASCULAR EXAM: Extremities with ischemic changes: dark tender are medial aspe  without Gangrene; without open wounds.     LE Pulses LEFT RIGHT   FEMORAL faintly palpable not  palpable    POPLITEAL  not palpable  not palpable   POSTERIOR TIBIAL not palpable   not palpable    DORSALIS PEDIS  ANTERIOR TIBIAL palpable  palpable    Abdomen: soft, NT, no palpable masses. Skin: no rashes, no ulcers, see extremities. Musculoskeletal: no muscle wasting or atrophy. Neurologic: A&O X 3; Appropriate Affect ; SENSATION: normal; MOTOR FUNCTION: moving all extremities equally, motor strength 4/5 throughout. Speech is fluent/normal.  CN 2-12 intact.   Non-Invasive Vascular Imaging: DATE: 06/11/2014 [facility] - ILIAC DUPLEX EVALUATION    INDICATION: Peripheral vascular disease    PREVIOUS INTERVENTION(S): Right common iliac artery percutaneous transluminal angioplasty and stent placed 05/07/2008; Known Left common iliac artery occlusion    DUPLEX EXAM:      Peak Systolic Velocity (cm/s)  AORTA - Proximal 67  AORTA - Mid 373  AORTA - Distal 210    RIGHT  LEFT  Peak Systolic Velocity (cm/s) Ratio (if abnormal) Waveform  Peak Systolic Velocity (cm/s) Ratio (if abnormal) Waveform  179  M Common Iliac Artery - Proximal     191  M Common Iliac Artery - Mid     136  M Common Iliac Artery - Distal     8  M External Iliac Artery - Proximal     30  M External Iliac Artery - Mid     50  M External Iliac Artery - Distal     17  M Internal Iliac Artery     0.74/0.63 Today's ABI / TBI 0.59/0.52  0.98/0.98 Previous ABI / TBI (05/24/2013 ) 0.66/0.58     Waveform:    M - Monophasic       B - Biphasic       T - Triphasic  If Ankle Brachial Index (ABI) or Toe Brachial Index (TBI) performed, please see complete report     ADDITIONAL FINDINGS: Note is made of small caliber abdominal aorta.     IMPRESSION: Sclerotic abdominal aorta with heterogeneous plaque and elevated velocities present suggestive of hemodynamically significant aortic stenosis at the infrarenal mid segment. Known Left common iliac artery occlusion. Dampened right iliac artery system with monophasic flow  throughout.    Compared to the previous exam:  Decreased ankle brachial indices since previous study on 05/24/2013.     ASSESSMENT: Heather Hart is a 64 y.o. female who is status post right CIA stent in 2009 with a known left common iliac artery occlusion. She has been performing the graduated walking program and her left calf claudication has improved slightly but she has developed a small ischemic area on the medial aspect of her left 5th toe tip and bilateral ABI's have decreased in the last year. However, waveforms in the left LE have improved from monophasic to biphasic, but right LE waveforms have worsened from triphasic to biphasic. Today's right iliac artery Duplex reveals sclerotic abdominal aorta with heterogeneous plaque and elevated velocities present suggestive of hemodynamically significant aortic stenosis at the infrarenal mid segment. Known Left common iliac artery occlusion. Dampened right iliac artery system with monophasic flow throughout. Note is made of small caliber abdominal aorta. She has apparently developed left LE arterial collaterals due to her graduated walking program.  Face to face time with patient was 30 minutes. Over 50% of this time was spent on counseling and coordination of care.    PLAN:  I discussed in depth with the patient the nature of atherosclerosis, and emphasized the importance of maximal medical management including  strict control of blood pressure, blood glucose, and lipid levels, obtaining regular exercise, and continued cessation of smoking.  The patient is aware that without maximal medical management the underlying atherosclerotic disease process will progress, limiting the benefit of any interventions.  Based on the patient's vascular studies and examination, and after discussing with Dr. Trula Slade, pt will return to clinic in 1-3 weeks with a CTA abd/pelvis with bilateral run off and follow up with Dr. Scot Dock.  The patient was given information about PAD including signs, symptoms, treatment, what symptoms should prompt the patient to seek immediate medical care, and risk reduction measures to take.  Clemon Chambers, RN, MSN, FNP-C [facility] Phone: [phone]  Clinic MD: Trula Slade  06/11/2014   11:53 AM

## 2014-06-11 NOTE — Addendum Note (Signed)
Addended by: Sharee PimpleMCCHESNEY, MARILYN K on: 06/11/2014 02:34 PM   Modules accepted: Orders

## 2014-06-29 ENCOUNTER — Other Ambulatory Visit: Payer: Self-pay | Admitting: *Deleted

## 2014-06-29 DIAGNOSIS — Z01812 Encounter for preprocedural laboratory examination: Secondary | ICD-10-CM

## 2014-06-29 LAB — CREATININE, SERUM: CREATININE: 0.76 mg/dL (ref 0.50–1.10)

## 2014-06-29 LAB — BUN: BUN: 16 mg/dL (ref 6–23)

## 2014-07-04 ENCOUNTER — Ambulatory Visit
Admission: RE | Admit: 2014-07-04 | Discharge: 2014-07-04 | Disposition: A | Discharge status: Home / Self Care | Payer: PRIVATE HEALTH INSURANCE | Source: Ambulatory Visit | Attending: Family | Admitting: Family

## 2014-07-04 DIAGNOSIS — Z95828 Presence of other vascular implants and grafts: Secondary | ICD-10-CM

## 2014-07-04 DIAGNOSIS — I739 Peripheral vascular disease, unspecified: Secondary | ICD-10-CM

## 2014-07-04 DIAGNOSIS — I745 Embolism and thrombosis of iliac artery: Secondary | ICD-10-CM

## 2014-07-04 MED ORDER — IOHEXOL 350 MG/ML SOLN
165.0000 mL | Freq: Once | INTRAVENOUS | Status: AC | PRN
  Administered 2014-07-04: 165 mL via INTRAVENOUS

## 2014-07-05 ENCOUNTER — Other Ambulatory Visit: Payer: Self-pay | Admitting: Gastroenterology

## 2014-07-10 ENCOUNTER — Encounter: Payer: Self-pay | Admitting: Vascular Surgery

## 2014-07-11 ENCOUNTER — Encounter: Payer: Self-pay | Admitting: Vascular Surgery

## 2014-07-11 ENCOUNTER — Ambulatory Visit (INDEPENDENT_AMBULATORY_CARE_PROVIDER_SITE_OTHER): Payer: PRIVATE HEALTH INSURANCE | Admitting: Vascular Surgery

## 2014-07-11 VITALS — BP 157/92 | HR 84 | Ht 61.0 in | Wt 185.5 lb

## 2014-07-11 DIAGNOSIS — I739 Peripheral vascular disease, unspecified: Secondary | ICD-10-CM | POA: Diagnosis not present

## 2014-07-11 DIAGNOSIS — I70219 Atherosclerosis of native arteries of extremities with intermittent claudication, unspecified extremity: Secondary | ICD-10-CM

## 2014-07-11 NOTE — Progress Notes (Signed)
    Established Intermittent Claudication  History of Present Illness  Heather Hart is a 64 y.o. (05/16/1951) female who presents for follow-up and discussion of CTA results. She is s/p right common iliac artery stent in 2009. She has known left common iliac artery occlusion. She was last seen on 06/11/14 by Rosalita ChessmanSuzanne Nickel and was noted to have a small ischemic area between her left 4th and 5th toes. Right iliac artery duplex also revealed elevated velocities. Her ABIs had also decreased from the previous year. She was advised to get a CTA abdomen/pelvis with runoff and to follow up in one month with Dr. Edilia Boickson.  Today, she says the wound between her left 4th and 5th toes has improved and notes that it was more "like a corn" than actual sore. She has claudication symptoms in her left thigh with walking that is manageable and does not bother her. She denies any rest pain or new non-healing wounds. She denies any symptoms on the right.   Physical Examination  Filed Vitals:   07/11/14 1343  BP: 157/92  Pulse: 84  Height: 5\' 1"  (1.549 m)  Weight: 185 lb 8 oz (84.142 kg)  SpO2: 99%   Body mass index is 35.07 kg/(m^2).  Physical Examination  Filed Vitals:   07/11/14 1343  BP: 157/92  Pulse: 84  Height: 5\' 1"  (1.549 m)  Weight: 185 lb 8 oz (84.142 kg)  SpO2: 99%   Body mass index is 35.07 kg/(m^2).  General: A&O x 3, WD Obese female in NAD  Pulmonary: Sym exp, good air movt  Vascular: Palpable right femoral pulse, palpable right dorsalis pedis pulse. Non palpable left femoral and pedal pulses.   Musculoskeletal: M/S 5/5 throughout. Extremities without ischemic changes. Wound of left 5th toe is healed.   Neurologic: Pain and light touch intact in extremities.   Data CTA abd/pelvis with bilateral runoff (07/04/14) In-stent restenosis of right common iliac artery stent. Unable to determine hemodynamic significance.  High grade left common iliac artery stenosis No significant  femoral-popliteal disease with three vessel tibial runoff bilaterally.   Medical Decision Making  Heather Hart is a 64 y.o. female who is s/p right common iliac stent (2009) and known left common iliac occlusion.   The patient's left 5th toe ischemic area has now resolved. Her CTA demonstrates three vessel tibial runoff bilaterally. The patient's claudication symptoms on the left are manageable and are not lifestyle limiting. She has not developed any new wounds nor having any rest pain. Her right common iliac stent demonstrates some in-stent restenosis but is patent and the patient denies any claudication issues on the right. She is on maximal medical management with aspirin and a statin. She will follow up in six months with ABIs and right iliac duplex. She knows to call sooner if she develops any problems.   Heather BergerKimberly Bijou Easler, PA-C [facility] [place] Office: [phone] Pager: [phone]  07/11/2014, 2:17 PM  This patient was seen in conjunction with Dr. Edilia Boickson.

## 2014-09-18 ENCOUNTER — Encounter (HOSPITAL_COMMUNITY): Payer: Self-pay | Admitting: *Deleted

## 2014-09-24 ENCOUNTER — Encounter (HOSPITAL_COMMUNITY): Payer: Self-pay | Admitting: Anesthesiology

## 2014-09-24 NOTE — Anesthesia Preprocedure Evaluation (Signed)
Anesthesia Evaluation  Patient identified by MRN, date of birth, ID band Patient awake    Reviewed: Allergy & Precautions, NPO status , Patient's Chart, lab work & pertinent test results  Airway Mallampati: II  TM Distance: >3 FB Neck ROM: Full    Dental no notable dental hx.    Pulmonary former smoker,  breath sounds clear to auscultation  Pulmonary exam normal       Cardiovascular hypertension, Pt. on medications + Peripheral Vascular Disease Rhythm:Regular Rate:Normal     Neuro/Psych negative neurological ROS  negative psych ROS   GI/Hepatic negative GI ROS, Neg liver ROS,   Endo/Other  diabetes, Type 2, Oral Hypoglycemic Agents  Renal/GU negative Renal ROS  negative genitourinary   Musculoskeletal negative musculoskeletal ROS (+)   Abdominal   Peds negative pediatric ROS (+)  Hematology negative hematology ROS (+)   Anesthesia Other Findings   Reproductive/Obstetrics negative OB ROS                             Anesthesia Physical Anesthesia Plan  ASA: II  Anesthesia Plan: MAC   Post-op Pain Management:    Induction: Intravenous  Airway Management Planned:   Additional Equipment:   Intra-op Plan:   Post-operative Plan:   Informed Consent: I have reviewed the patients History and Physical, chart, labs and discussed the procedure including the risks, benefits and alternatives for the proposed anesthesia with the patient or authorized representative who has indicated his/her understanding and acceptance.   Dental advisory given  Plan Discussed with: CRNA  Anesthesia Plan Comments:         Anesthesia Quick Evaluation

## 2014-09-25 ENCOUNTER — Ambulatory Visit (HOSPITAL_COMMUNITY): Payer: PRIVATE HEALTH INSURANCE | Admitting: Anesthesiology

## 2014-09-25 ENCOUNTER — Encounter (HOSPITAL_COMMUNITY): Payer: Self-pay | Admitting: Certified Registered Nurse Anesthetist

## 2014-09-25 ENCOUNTER — Encounter (HOSPITAL_COMMUNITY)
Admission: RE | Disposition: A | Discharge status: Home / Self Care | Payer: Self-pay | Source: Ambulatory Visit | Attending: Gastroenterology

## 2014-09-25 ENCOUNTER — Ambulatory Visit (HOSPITAL_COMMUNITY)
Admission: RE | Admit: 2014-09-25 | Discharge: 2014-09-25 | Disposition: A | Discharge status: Home / Self Care | Payer: PRIVATE HEALTH INSURANCE | Source: Ambulatory Visit | Attending: Gastroenterology | Admitting: Gastroenterology

## 2014-09-25 DIAGNOSIS — E119 Type 2 diabetes mellitus without complications: Secondary | ICD-10-CM | POA: Insufficient documentation

## 2014-09-25 DIAGNOSIS — E780 Pure hypercholesterolemia: Secondary | ICD-10-CM | POA: Diagnosis not present

## 2014-09-25 DIAGNOSIS — Z87891 Personal history of nicotine dependence: Secondary | ICD-10-CM | POA: Insufficient documentation

## 2014-09-25 DIAGNOSIS — Z7982 Long term (current) use of aspirin: Secondary | ICD-10-CM | POA: Insufficient documentation

## 2014-09-25 DIAGNOSIS — Z90710 Acquired absence of both cervix and uterus: Secondary | ICD-10-CM | POA: Diagnosis not present

## 2014-09-25 DIAGNOSIS — I739 Peripheral vascular disease, unspecified: Secondary | ICD-10-CM | POA: Insufficient documentation

## 2014-09-25 DIAGNOSIS — Z1211 Encounter for screening for malignant neoplasm of colon: Secondary | ICD-10-CM | POA: Insufficient documentation

## 2014-09-25 DIAGNOSIS — F419 Anxiety disorder, unspecified: Secondary | ICD-10-CM | POA: Insufficient documentation

## 2014-09-25 DIAGNOSIS — K5730 Diverticulosis of large intestine without perforation or abscess without bleeding: Secondary | ICD-10-CM | POA: Diagnosis not present

## 2014-09-25 DIAGNOSIS — I10 Essential (primary) hypertension: Secondary | ICD-10-CM | POA: Diagnosis not present

## 2014-09-25 HISTORY — PX: COLONOSCOPY WITH PROPOFOL: SHX5780

## 2014-09-25 SURGERY — COLONOSCOPY WITH PROPOFOL
Anesthesia: Monitor Anesthesia Care

## 2014-09-25 MED ORDER — PROPOFOL 10 MG/ML IV BOLUS
INTRAVENOUS | Status: DC | PRN
  Administered 2014-09-25: 50 mg via INTRAVENOUS
  Administered 2014-09-25: 100 mg via INTRAVENOUS
  Administered 2014-09-25: 30 mg via INTRAVENOUS
  Administered 2014-09-25 (×2): 50 mg via INTRAVENOUS

## 2014-09-25 MED ORDER — LACTATED RINGERS IV SOLN
INTRAVENOUS | Status: DC
  Administered 2014-09-25: 1000 mL via INTRAVENOUS

## 2014-09-25 MED ORDER — LIDOCAINE HCL (CARDIAC) 20 MG/ML IV SOLN
INTRAVENOUS | Status: DC | PRN
  Administered 2014-09-25: 50 mg via INTRAVENOUS

## 2014-09-25 MED ORDER — LIDOCAINE HCL (CARDIAC) 20 MG/ML IV SOLN
INTRAVENOUS | Status: AC
  Filled 2014-09-25: qty 5

## 2014-09-25 MED ORDER — PROPOFOL 10 MG/ML IV BOLUS
INTRAVENOUS | Status: AC
  Filled 2014-09-25: qty 20

## 2014-09-25 MED ORDER — SODIUM CHLORIDE 0.9 % IV SOLN
INTRAVENOUS | Status: DC
Start: 2014-09-25 — End: 2014-09-25

## 2014-09-25 SURGICAL SUPPLY — 22 items

## 2014-09-25 NOTE — Op Note (Signed)
Procedure: Screening colonoscopy. Normal screening colonoscopy performed on 06/11/2003  Endoscopist: Danise EdgeMartin Johnson  Premedication: Propofol administered by anesthesia  Procedure: The patient was placed in the left lateral decubitus position. Anal inspection and digital rectal exam were normal. The Pentax pediatric colonoscope was introduced into the rectum and advanced to the cecum. A normal-appearing appendiceal orifice and ileocecal valve were identified. Colonic preparation for the exam today was good. Withdrawal time was 9 minutes  Rectum. Normal. Retroflexed view of the distal rectum was normal  Sigmoid colon. Colonic diverticulosis  Descending colon. Normal  Splenic flexure. Normal.  Transverse colon. Normal  Hepatic flexure. Normal  Ascending colon. Normal  Cecum and ileocecal valve. Normal  Assessment: Normal screening colonoscopy  Recommendation: Schedule repeat screening colonoscopy in 10 years

## 2014-09-25 NOTE — Transfer of Care (Signed)
Immediate Anesthesia Transfer of Care Note  Patient: Heather GilfordSheila T Kirn  Procedure(s) Performed: Procedure(s): COLONOSCOPY WITH PROPOFOL (N/A)  Patient Location: PACU  Anesthesia Type:MAC  Level of Consciousness: awake, alert  and oriented  Airway & Oxygen Therapy: Patient Spontanous Breathing and Patient connected to face mask oxygen  Post-op Assessment: Report given to RN and Post -op Vital signs reviewed and stable  Post vital signs: Reviewed and stable  Last Vitals:  Filed Vitals:   09/25/14 0903  BP:   Pulse:   Temp:   Resp: 18    Complications: No apparent anesthesia complications

## 2014-09-25 NOTE — H&P (Signed)
  Procedure: Screening colonoscopy. Normal screening colonoscopy performed on 06/11/2003  History: The patient is a 64 year old female born 04/21/1951. She is scheduled to undergo a screening colonoscopy today.  Past medical history: Hypertension. Type 2 diabetes mellitus complicated by peripheral vascular disease. Hypercholesterolemia. Chronic anxiety disorder. Cesarean section. Hysterectomy. Peripheral vascular stent.  Medication allergies: None  Exam: The patient is alert and lying comfortably on the endoscopy stretcher. Abdomen is soft and nontender to palpation. Lungs are clear to auscultation. Cardiac exam reveals a regular rhythm.  Plan: Proceed with screening colonoscopy

## 2014-09-25 NOTE — Discharge Instructions (Signed)
Colonoscopy, Care After °These instructions give you information on caring for yourself after your procedure. Your doctor may also give you more specific instructions. Call your doctor if you have any problems or questions after your procedure. °HOME CARE °· Do not drive for 24 hours. °· Do not sign important papers or use machinery for 24 hours. °· You may shower. °· You may go back to your usual activities, but go slower for the first 24 hours. °· Take rest breaks often during the first 24 hours. °· Walk around or use warm packs on your belly (abdomen) if you have belly cramping or gas. °· Drink enough fluids to keep your pee (urine) clear or pale yellow. °· Resume your normal diet. Avoid heavy or fried foods. °· Avoid drinking alcohol for 24 hours or as told by your doctor. °· Only take medicines as told by your doctor. °If a tissue sample (biopsy) was taken during the procedure:  °· Do not take aspirin or blood thinners for 7 days, or as told by your doctor. °· Do not drink alcohol for 7 days, or as told by your doctor. °· Eat soft foods for the first 24 hours. °GET HELP IF: °You still have a small amount of blood in your poop (stool) 2-3 days after the procedure. °GET HELP RIGHT AWAY IF: °· You have more than a small amount of blood in your poop. °· You see clumps of tissue (blood clots) in your poop. °· Your belly is puffy (swollen). °· You feel sick to your stomach (nauseous) or throw up (vomit). °· You have a fever. °· You have belly pain that gets worse and medicine does not help. °MAKE SURE YOU: °· Understand these instructions. °· Will watch your condition. °· Will get help right away if you are not doing well or get worse. °Document Released: 06/13/2010 Document Revised: 05/16/2013 Document Reviewed: 01/16/2013 °ExitCare® Patient Information ©2015 [employer]. This information is not intended to replace advice given to you by your health care provider. Make sure you discuss any questions you have with  your health care provider. ° °

## 2014-09-25 NOTE — Anesthesia Postprocedure Evaluation (Signed)
  Anesthesia Post-op Note  Patient: Heather GilfordSheila T Hart  Procedure(s) Performed: Procedure(s) (LRB): COLONOSCOPY WITH PROPOFOL (N/A)  Patient Location: PACU  Anesthesia Type: MAC  Level of Consciousness: awake and alert   Airway and Oxygen Therapy: Patient Spontanous Breathing  Post-op Pain: mild  Post-op Assessment: Post-op Vital signs reviewed, Patient's Cardiovascular Status Stable, Respiratory Function Stable, Patent Airway and No signs of Nausea or vomiting  Last Vitals:  Filed Vitals:   09/25/14 0920  BP: 163/88  Pulse: 70  Temp:   Resp: 14    Post-op Vital Signs: stable   Complications: No apparent anesthesia complications

## 2014-09-26 ENCOUNTER — Encounter (HOSPITAL_COMMUNITY): Payer: Self-pay | Admitting: Gastroenterology

## 2014-09-26 LAB — GLUCOSE, CAPILLARY: GLUCOSE-CAPILLARY: 148 mg/dL — AB (ref 70–99)

## 2014-11-16 ENCOUNTER — Ambulatory Visit (INDEPENDENT_AMBULATORY_CARE_PROVIDER_SITE_OTHER): Payer: PRIVATE HEALTH INSURANCE | Admitting: Interventional Cardiology

## 2014-11-16 ENCOUNTER — Encounter: Payer: Self-pay | Admitting: Interventional Cardiology

## 2014-11-16 VITALS — BP 180/94 | HR 73 | Ht 61.0 in | Wt 183.8 lb

## 2014-11-16 DIAGNOSIS — E1151 Type 2 diabetes mellitus with diabetic peripheral angiopathy without gangrene: Secondary | ICD-10-CM

## 2014-11-16 DIAGNOSIS — I10 Essential (primary) hypertension: Secondary | ICD-10-CM | POA: Diagnosis not present

## 2014-11-16 DIAGNOSIS — E785 Hyperlipidemia, unspecified: Secondary | ICD-10-CM | POA: Diagnosis not present

## 2014-11-16 DIAGNOSIS — E119 Type 2 diabetes mellitus without complications: Secondary | ICD-10-CM | POA: Insufficient documentation

## 2014-11-16 DIAGNOSIS — I739 Peripheral vascular disease, unspecified: Secondary | ICD-10-CM

## 2014-11-16 NOTE — Progress Notes (Signed)
Patient ID: Heather Hart, female   DOB: [birth year], 64 y.o.   MRN: [id number]     Cardiology Office Note   Date:  11/16/2014   ID:  Heather Hart, DOB April 24, 1951, MRN [id number]  PCP:  Kristie Cowman, MD    No chief complaint on file. premature beats   Wt Readings from Last 3 Encounters:  11/16/14 183 lb 12.8 oz (83.371 kg)  09/25/14 185 lb (83.915 kg)  07/11/14 185 lb 8 oz (84.142 kg)       History of Present Illness: Heather Hart is a 64 y.o. female  Who has RF for CAD, including hypertension, diabetes, hyperlipidemia. She was at her primary care doctor for a routine exam. A slightly irregular heartbeat with frequent premature beat was auscultated. Also it was noted that she had a heart murmur.  The patient has not felt any of the irregular heartbeat. She has not had shortness of breath or chest pain. She denies lower extremity edema. She exercises on a regular basis doing various activities. She has not had any problems or change in her exercise tolerance. She is eating healthy. She is trying to lose weight.  Her brother had an MI in his 56s. That was the most significant family history.    Past Medical History  Diagnosis Date  . Hypertension   . Hyperlipidemia   . Peripheral vascular disease   . Diabetes mellitus     Diet and exercise  . Peripheral arterial disease     Past Surgical History  Procedure Laterality Date  . Stenting of right common iliac artery   05/07/2008  . Aortogram  05/07/2008, 06/22/2005  . Cesarean section  1976  and 1979  . Partial hysterectomy    . Colonoscopy with propofol N/A 09/25/2014    Procedure: COLONOSCOPY WITH PROPOFOL;  Surgeon: Charolett Bumpers, MD;  Location: [facility];  Service: Endoscopy;  Laterality: N/A;     Current Outpatient Prescriptions  Medication Sig Dispense Refill  . ALPRAZolam (XANAX) 1 MG tablet Take 1 mg by mouth at bedtime as needed.    Marland Kitchen amLODipine-benazepril (LOTREL) 5-20 MG per capsule Take 1  capsule by mouth daily.    Marland Kitchen aspirin EC 81 MG tablet Take 81 mg by mouth daily.      Marland Kitchen buPROPion (ZYBAN) 150 MG 12 hr tablet Take 150 mg by mouth 2 (two) times daily.      . Calcium Carbonate-Vitamin D (CALCIUM + D) 600-200 MG-UNIT TABS Take 1 tablet by mouth 2 (two) times daily.      . hydrochlorothiazide (HYDRODIURIL) 25 MG tablet Take 25 mg by mouth daily.      . metFORMIN (GLUCOPHAGE) 500 MG tablet Take 500 mg by mouth 2 (two) times daily with a meal.    . ramipril (ALTACE) 10 MG capsule Take 10 mg by mouth daily.      . rosuvastatin (CRESTOR) 20 MG tablet Take 20 mg by mouth daily.     No current facility-administered medications for this visit.    Allergies:   Review of patient's allergies indicates no known allergies.    Social History:  The patient  reports that she quit smoking about 17 years ago. Her smoking use included Cigarettes. She has a 5 pack-year smoking history. She has never used smokeless tobacco. She reports that she does not drink alcohol or use illicit drugs.   Family History:  The patient's   family history includes Cancer in her brother, paternal grandmother, and sister;  Heart attack in her brother and father; Heart disease in her brother and father; Hypertension in her brother, father, mother, and son; Stroke in her maternal grandmother and mother.    ROS:  Please see the history of present illness.   Otherwise, review of systems are positive for difficulty losing weight.   All other systems are reviewed and negative.    PHYSICAL EXAM: VS:  BP 180/94 mmHg  Pulse 73  Ht  (1.549 m)  Wt 183 lb 12.8 oz (83.371 kg)  BMI 34.75 kg/m2 , BMI Body mass index is 34.75 kg/(m^2). GEN: Well nourished, well developed, in no acute distress HEENT: normal Neck: no JVD, carotid bruits, or masses Cardiac: RRR, , premature beats; 2 out of 6 early systolic murmurs, rubs, or gallops,no edema  Respiratory:  clear to auscultation bilaterally, normal work of breathing GI:  soft, nontender, nondistended, + BS MS: no deformity or atrophy Skin: warm and dry, no rash Neuro:  Strength and sensation are intact Psych: euthymic mood, full affect   EKG:   The ekg ordered today demonstrates , NSR, PACs, lateral ST segment changes, borderline LVH   Recent Labs: 06/29/2014: BUN 16; Creat 0.76   Lipid Panel No results found for: CHOL, TRIG, HDL, CHOLHDL, VLDL, LDLCALC, LDLDIRECT   Other studies Reviewed: Additional studies/ records that were reviewed today with results demonstrating:Dr. Bosie Clos OV reviewed.    ASSESSMENT AND PLAN:  1. PACs: Asymptomatic. No further workup indicated at this time. 2. Murmur: Soft, early systolic murmur. Sounds benign. She does not have any symptoms. Will not order echocardiogram at this time. If her symptoms change, she will let us know. 3. HTN: Well controlled at home. Continue current medications. 4. HYperlipidemia: LDL target less than 100 given her diabetes. 5. Diabetes: Managed by her primary care doctor. Stressed the importance of weight loss to help with her blood sugars as well. 6. Peripheral arterial disease: She has had an iliac stent. She is at risk for coronary artery disease but is not having any symptoms at this time. She will let us know if she does have any symptoms.   Current medicines are reviewed at length with the patient today.  The patient concerns regarding her medicines were addressed.  The following changes have been made:  No change  Labs/ tests ordered today include: None  No orders of the defined types were placed in this encounter.    Recommend 150 minutes/week of aerobic exercise Low fat, low carb, high fiber diet recommended  Disposition:   FU in when necessary   Delorise Jackson., MD  11/16/2014 9:45 AM    [facility] [address] [place], [place], [place]  [address] Phone: [phone]; Fax: [phone]

## 2014-11-16 NOTE — Patient Instructions (Signed)
Medication Instructions:  Same-no change  Labwork: None  Testing/Procedures: None  Follow-Up: Your physician recommends that you schedule a follow-up appointment in: as needed      

## 2015-01-03 ENCOUNTER — Other Ambulatory Visit: Payer: Self-pay | Admitting: *Deleted

## 2015-01-03 DIAGNOSIS — I739 Peripheral vascular disease, unspecified: Secondary | ICD-10-CM

## 2015-01-03 DIAGNOSIS — Z9862 Peripheral vascular angioplasty status: Secondary | ICD-10-CM

## 2015-01-15 ENCOUNTER — Encounter: Payer: Self-pay | Admitting: Vascular Surgery

## 2015-01-16 ENCOUNTER — Ambulatory Visit: Payer: PRIVATE HEALTH INSURANCE | Admitting: Vascular Surgery

## 2015-01-16 ENCOUNTER — Inpatient Hospital Stay (HOSPITAL_COMMUNITY): Admission: RE | Admit: 2015-01-16 | Payer: PRIVATE HEALTH INSURANCE | Source: Ambulatory Visit

## 2015-04-15 ENCOUNTER — Other Ambulatory Visit: Payer: Self-pay

## 2015-04-15 DIAGNOSIS — Z1231 Encounter for screening mammogram for malignant neoplasm of breast: Secondary | ICD-10-CM

## 2015-05-17 ENCOUNTER — Ambulatory Visit
Admission: RE | Admit: 2015-05-17 | Discharge: 2015-05-17 | Disposition: A | Discharge status: Home / Self Care | Payer: PRIVATE HEALTH INSURANCE | Source: Ambulatory Visit

## 2015-05-17 DIAGNOSIS — Z1231 Encounter for screening mammogram for malignant neoplasm of breast: Secondary | ICD-10-CM

## 2015-11-15 ENCOUNTER — Ambulatory Visit
Admission: RE | Admit: 2015-11-15 | Discharge: 2015-11-15 | Disposition: A | Discharge status: Home / Self Care | Payer: PRIVATE HEALTH INSURANCE | Source: Ambulatory Visit | Attending: Nurse Practitioner | Admitting: Nurse Practitioner

## 2015-11-15 ENCOUNTER — Other Ambulatory Visit: Payer: Self-pay | Admitting: Nurse Practitioner

## 2015-11-15 DIAGNOSIS — Z77120 Contact with and (suspected) exposure to mold (toxic): Secondary | ICD-10-CM

## 2016-04-24 ENCOUNTER — Other Ambulatory Visit: Payer: Self-pay | Admitting: Internal Medicine

## 2016-04-24 DIAGNOSIS — Z1231 Encounter for screening mammogram for malignant neoplasm of breast: Secondary | ICD-10-CM

## 2016-05-06 IMAGING — CT CT ANGIO AOBIFEM WO/W CM
1 of 8 series · 11 of 33 positions shown · IV contrast ([ID] OMNI 350)
Comparison: None available

CLINICAL DATA: PVD, bilat tingling in feet, pain in legs with
prolonged standing and walking long distances. S/p iliac stent [id number].
C sect x 2, Partial hysterectomy

EXAM:
CT ANGIOGRAM ABDOMINAL AORTA AND BILATERAL LOWER EXTREMITIES WITH
CONTRAST
TECHNIQUE: Axial helical CT of the abdominal aorta and bilateral lower
extremities after power injection of intravenous contrast. Coronal
and sagittal reconstructions were generated for vascular evaluation.
CONTRAST:  165mL OMNIPAQUE IOHEXOL 350 MG/ML SOLN

[Series 5: runoff · axial · 0.81mm/px · z∈[-1137,-92]mm · 11 of 500 slices shown]
[im 42/500  soft-tissue]
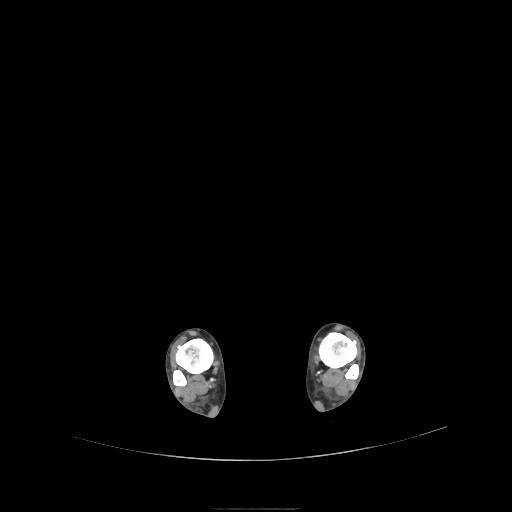
[im 84/500  bone]
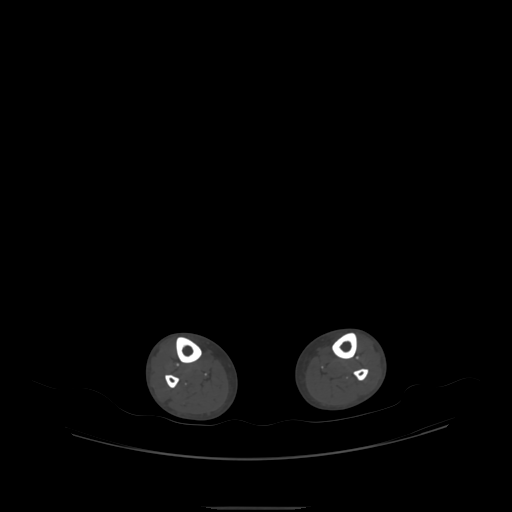
[im 125/500  soft-tissue]
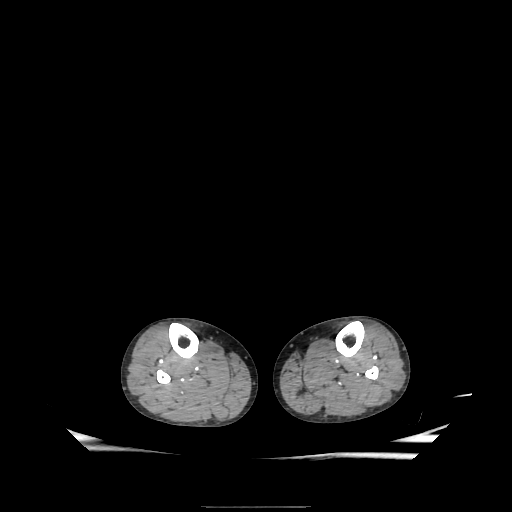
[im 167/500  bone]
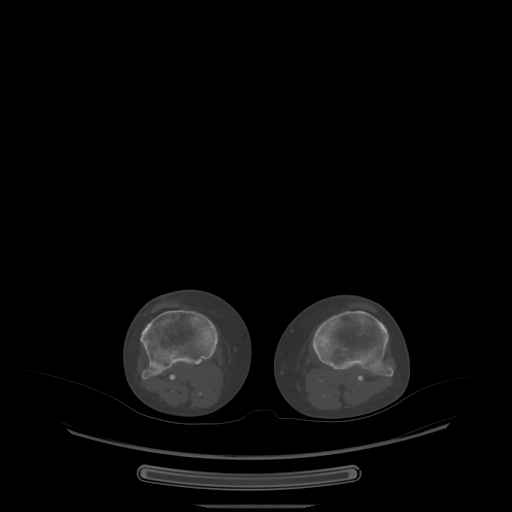
[im 208/500  soft-tissue]
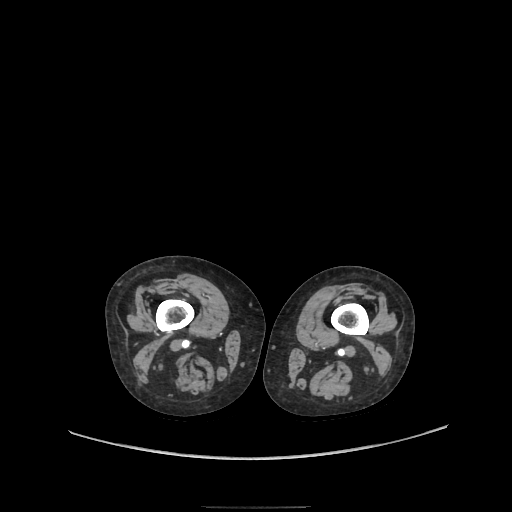
[im 250/500  bone]
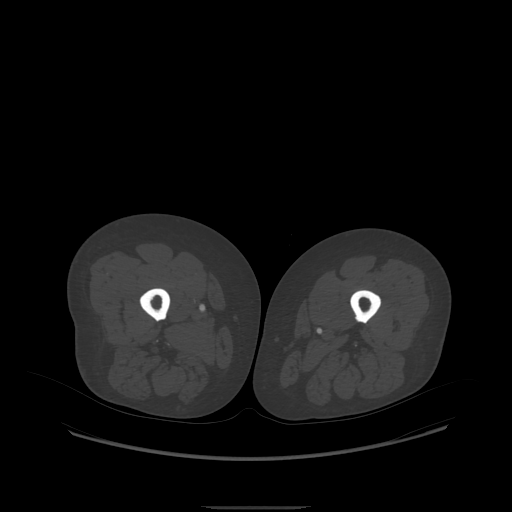
[im 292/500  soft-tissue]
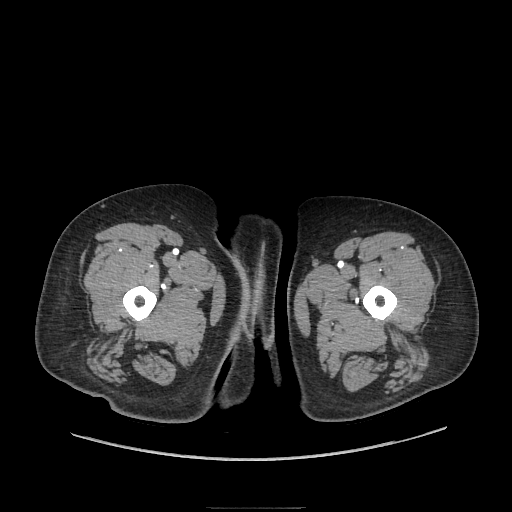
[im 333/500  bone]
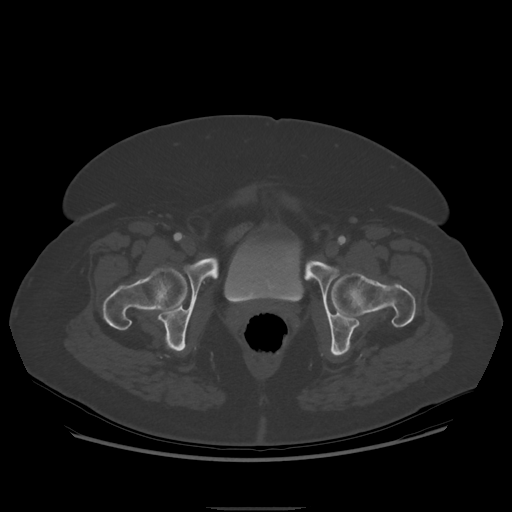
[im 375/500  soft-tissue]
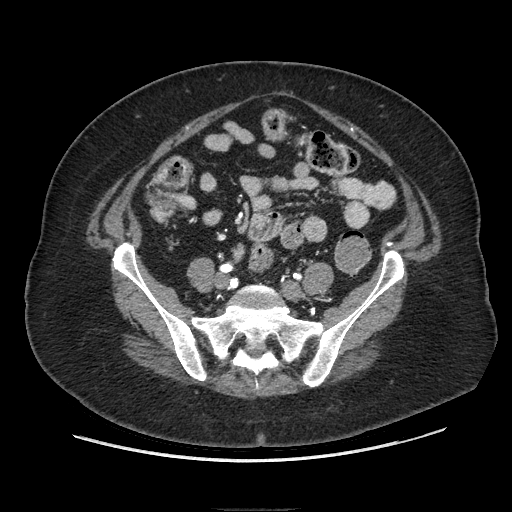
[im 416/500  bone]
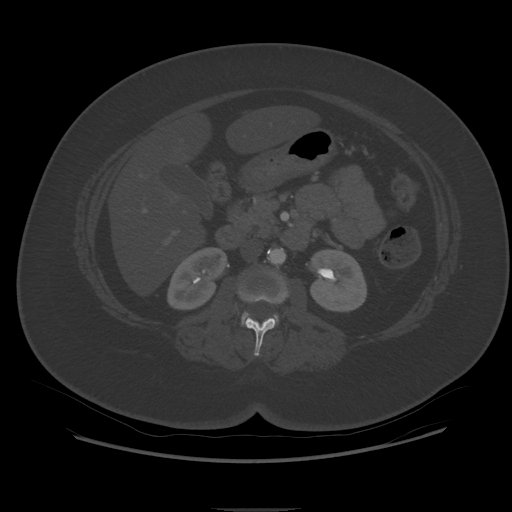
[im 458/500  soft-tissue]
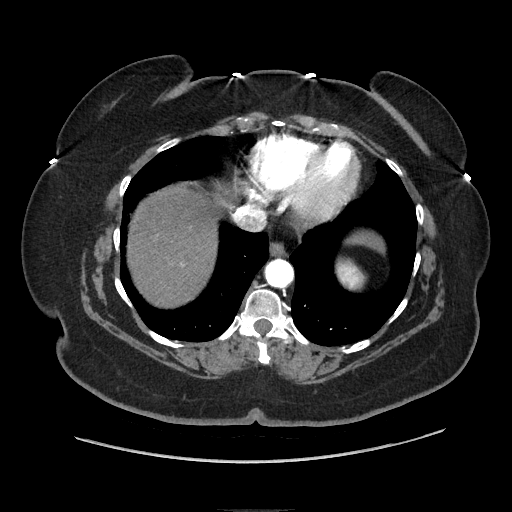

[11 of 33 positions shown; findings below may reference images not displayed]

FINDINGS: Arterial findings:

Aorta: Mild partially calcified plaque most severe in the infrarenal
segment. No dissection, aneurysm, or stenosis.

Celiac axis:         Patent

Superior mesenteric: Patent

Left renal:          Duplicated, inferior dominant, both patent.

Right renal:         Single, patent

Inferior mesenteric: Patent

Left iliac: High-grade near occlusive stenosis just beyond the
origin of the common iliac, and a moderate long segment stenosis
through the distal common iliac. Internal and external iliac
arteries unremarkable.

Right iliac: Stat across the common iliac origin with some in-stent
restenosis of indeterminate hemodynamic significance. The distal
common iliac is widely patent. Minimal plaque in the internal iliac.
External iliac widely patent.

Left lower extremity: Minimal nonocclusive plaque in the common
femoral artery. Profunda femora branches patent. SFA patent.
Popliteal artery patent. Contiguous 3 vessel tibial runoff.

Right lower extremity: Minimal nonocclusive plaque in the common
femoral artery. No significant femoral-popliteal arterial plaque or
stenosis. Contiguous 3 vessel tibial runoff.

Venous findings: Dedicated venous phase imaging not obtained. Patent
portal vein, splenic vein, and bilateral renal veins noted.

Review of the MIP images confirms the above findings.

Nonvascular findings: Visualized lower lungs clear. Unremarkable
arterial phase evaluation of liver, gallbladder, spleen, pancreas,
kidneys, right adrenal gland. 2.1 cm nonspecific left adrenal
nodule. Stomach, small bowel, and colon are nondilated. Urinary
bladder incompletely distended. Bilateral pelvic phleboliths. No
ascites. No free air. No adenopathy. Mild facet DJD L4-5 and L5-S1.
IMPRESSION: 1. High-grade LEFT  common iliac artery stenosis
2. In-stent restenosis of the RIGHT common iliac artery origin
stent, of indeterminate hemodynamic significance.
3. No significant femoral-popliteal disease with contiguous tibial
runoff bilaterally.

## 2016-05-15 ENCOUNTER — Other Ambulatory Visit: Payer: Self-pay | Admitting: *Deleted

## 2016-05-15 DIAGNOSIS — Z48812 Encounter for surgical aftercare following surgery on the circulatory system: Secondary | ICD-10-CM

## 2016-05-15 DIAGNOSIS — I739 Peripheral vascular disease, unspecified: Secondary | ICD-10-CM

## 2016-05-20 ENCOUNTER — Ambulatory Visit
Admission: RE | Admit: 2016-05-20 | Discharge: 2016-05-20 | Disposition: A | Discharge status: Home / Self Care | Payer: PRIVATE HEALTH INSURANCE | Source: Ambulatory Visit | Attending: Internal Medicine | Admitting: Internal Medicine

## 2016-05-20 DIAGNOSIS — Z1231 Encounter for screening mammogram for malignant neoplasm of breast: Secondary | ICD-10-CM

## 2016-05-27 ENCOUNTER — Encounter: Payer: Self-pay | Admitting: Family

## 2016-05-27 ENCOUNTER — Ambulatory Visit (INDEPENDENT_AMBULATORY_CARE_PROVIDER_SITE_OTHER): Payer: BC Managed Care – PPO | Admitting: Family

## 2016-05-27 ENCOUNTER — Ambulatory Visit (HOSPITAL_COMMUNITY)
Admission: RE | Admit: 2016-05-27 | Discharge: 2016-05-27 | Disposition: A | Discharge status: Home / Self Care | Payer: BLUE CROSS/BLUE SHIELD | Source: Ambulatory Visit | Attending: Family | Admitting: Family

## 2016-05-27 VITALS — BP 176/94 | HR 75 | Temp 98.8°F | Resp 18 | Ht 61.0 in | Wt 181.4 lb

## 2016-05-27 DIAGNOSIS — Z87891 Personal history of nicotine dependence: Secondary | ICD-10-CM | POA: Diagnosis not present

## 2016-05-27 DIAGNOSIS — Q253 Supravalvular aortic stenosis: Secondary | ICD-10-CM | POA: Diagnosis not present

## 2016-05-27 DIAGNOSIS — I70219 Atherosclerosis of native arteries of extremities with intermittent claudication, unspecified extremity: Secondary | ICD-10-CM | POA: Diagnosis not present

## 2016-05-27 DIAGNOSIS — Z95828 Presence of other vascular implants and grafts: Secondary | ICD-10-CM

## 2016-05-27 DIAGNOSIS — E1151 Type 2 diabetes mellitus with diabetic peripheral angiopathy without gangrene: Secondary | ICD-10-CM | POA: Insufficient documentation

## 2016-05-27 DIAGNOSIS — I10 Essential (primary) hypertension: Secondary | ICD-10-CM | POA: Diagnosis not present

## 2016-05-27 DIAGNOSIS — Z48812 Encounter for surgical aftercare following surgery on the circulatory system: Secondary | ICD-10-CM

## 2016-05-27 DIAGNOSIS — I745 Embolism and thrombosis of iliac artery: Secondary | ICD-10-CM | POA: Diagnosis not present

## 2016-05-27 DIAGNOSIS — I739 Peripheral vascular disease, unspecified: Secondary | ICD-10-CM

## 2016-05-27 DIAGNOSIS — E785 Hyperlipidemia, unspecified: Secondary | ICD-10-CM | POA: Diagnosis not present

## 2016-05-27 DIAGNOSIS — Q251 Coarctation of aorta: Secondary | ICD-10-CM

## 2016-05-27 NOTE — Progress Notes (Signed)
[facility] [place]   CC: Follow up peripheral artery occlusive disease  History of Present Illness Heather Hart is a 66 y.o. female patient of Dr. Edilia Bo who is status post right CIA stent in 2009 with a known left common iliac artery occlusion and known stenosis of the abdominal aorta.   Dr. Edilia Bo last evaluated pt on 07-11-14. At that time the patient's left 5th toe ischemic area had resolved. Her CTA demonstrated three vessel tibial runoff bilaterally. The patient's claudication symptoms on the left were manageable and are not lifestyle limiting. She had not developed any new wounds nor having any rest pain. Her right common iliac stent demonstrated some in-stent restenosis but was patent and the patient denied any claudication issues on the right. She is on maximal medical management with aspirin and a statin. She was to follow up in six months with ABIs and right iliac duplex. Dr. Edilia Bo advised her to call sooner if she developed any problems.   She returns today for follow up.  About a month ago, December 2017, she reports starting to have right LQ intermittent pain if she stands too long, right buttock and posterior right leg pain pain after walking about 50 feet. She denies any claudication like symptoms in her left LE. She denies non healing wounds in her feet/legs.   She denies any history of stroke or TIA symptoms, denies heart problems.    Last creatinine result on file was 0.76 on 06-29-14.   Diabetes: She takes Januvia, metformin, and glimepiride; no recent A1C result on file  Pt smoker: former smoker, quit in 2009  Pt meds include: Statin :Yes ASA: Yes Other anticoagulants/antiplatelets: no    Past Medical History:  Diagnosis Date  . Diabetes mellitus    Diet and exercise  . Hyperlipidemia   . Hypertension   . Peripheral arterial disease (HCC)   . Peripheral vascular disease [facility])     Social History Social History  Substance  Use Topics  . Smoking status: Former Smoker    Packs/day: 0.25    Years: 20.00    Types: Cigarettes    Quit date: 11/26/1996  . Smokeless tobacco: Never Used  . Alcohol use No    Family History Family History  Problem Relation Age of Onset  . Heart attack Brother   . Cancer Brother   . Hypertension Brother   . Heart disease Brother     Heart Disease before age 49  . Hypertension Mother   . Stroke Mother   . Hypertension Father   . Heart disease Father     Heart Disease before age 86  . Heart attack Father   . Cancer Sister   . Stroke Maternal Grandmother   . Cancer Paternal Grandmother   . Hypertension Son     Past Surgical History:  Procedure Laterality Date  . aortogram  05/07/2008, 06/22/2005  . CESAREAN SECTION  1976  and 1979  . COLONOSCOPY WITH PROPOFOL N/A 09/25/2014   Procedure: COLONOSCOPY WITH PROPOFOL;  Surgeon: Charolett Bumpers, MD;  Location: [facility];  Service: Endoscopy;  Laterality: N/A;  . PARTIAL HYSTERECTOMY    . stenting of right common iliac artery   05/07/2008    No Known Allergies  Current Outpatient Prescriptions  Medication Sig Dispense Refill  . ALPRAZolam (XANAX) 1 MG tablet Take 1 mg by mouth at bedtime as needed.    Marland Kitchen amLODipine-benazepril (LOTREL) 5-20 MG per capsule Take 1 capsule by mouth daily.    Marland Kitchen  aspirin EC 81 MG tablet Take 81 mg by mouth daily.      Marland Kitchen buPROPion (ZYBAN) 150 MG 12 hr tablet Take 150 mg by mouth 2 (two) times daily.      . Calcium Carbonate-Vitamin D (CALCIUM + D) 600-200 MG-UNIT TABS Take 1 tablet by mouth 2 (two) times daily.      . hydrochlorothiazide (HYDRODIURIL) 25 MG tablet Take 25 mg by mouth daily.      . metFORMIN (GLUCOPHAGE) 500 MG tablet Take 500 mg by mouth 2 (two) times daily with a meal.    . rosuvastatin (CRESTOR) 20 MG tablet Take 20 mg by mouth daily.    Marland Kitchen glimepiride (AMARYL) 4 MG tablet   0  . ramipril (ALTACE) 10 MG capsule Take 10 mg by mouth daily.       No current facility-administered  medications for this visit.     ROS: See HPI for pertinent positives and negatives.   Physical Examination  Vitals:   05/27/16 1426 05/27/16 1432  BP: (!) 184/96 (!) 176/94  Pulse: 75   Resp: 18   Temp: 98.8 F (37.1 C)   TempSrc: Oral   SpO2: 96%   Weight: 181 lb 6.4 oz (82.3 kg)   Height: 5\' 1"  (1.549 m)    Body mass index is 34.28 kg/m.  General: A&O x 3, WDWN, obese female Gait: normal Eyes: PERRLA, Pulmonary: Respirations are non labored, CTAB, without wheezes, rales or rhonchi Cardiac: regular rhythm, no detected murmur     Carotid Bruits Left Right   Negative Negative  Aorta: is not palpable Radial pulses: are 2+ and =   VASCULAR EXAM: Extremities without ischemic changes, without Gangrene; without open wounds. Right toes are cool, right great toe tip is slightly dusky with sluggish capillary refill      LE Pulses LEFT RIGHT   FEMORAL faintly palpable not  palpable    POPLITEAL  not palpable  not palpable   POSTERIOR TIBIAL not palpable   not palpable    DORSALIS PEDIS  ANTERIOR TIBIAL not palpable  not palpable    Abdomen: soft, NT, no palpable masses. Skin: no rashes, no ulcers, see extremities. Musculoskeletal: no muscle wasting or atrophy. Neurologic: A&O X 3; Appropriate Affect ; SENSATION: normal; MOTOR FUNCTION: moving all extremities equally, motor strength 4/5 throughout. Speech is fluent/normal.  CN 2-12 intact.     ASSESSMENT: Heather Hart is a 66 y.o. female who is status post right CIA stent in 2009 with a known left common iliac artery occlusion and known stenosis of the abdominal aorta.   I advised pt to see her PCP as soon as possible re her uncontrolled hypertension.  About a month ago,  December 2017, she reports starting to have right LQ intermittent pain if she stands too long, right buttock and posterior right leg pain pain after walking about 50 feet. She denies any claudication like symptoms in her left LE.  Right toes are cool, tip of right great toe is dusky with sluggish capillary refill.   DATA Today's right aortoiliac duplex: unable to adequately visualize abdominal vasculature in its entirety due to overlying bowel gas. Severely dampened and monophasic flow involving the right EIA suggestive of a more proximal stenosis.   Today's ABI's: decline bilaterally. Right: decline to 0.45 with monophasicwaveforms from 0.74 with biphasic waveforms; TBI today is 0.34 (declined from 0.63) Left: decline to 0.45 with monophasic waveforms from 0.59 with biphasic waveforms; TBI today is 0.41 (declined from 0.52)  06-11-14 right iliac artery Duplex reveals sclerotic abdominal aorta with heterogeneous plaque and elevated velocities present suggestive of hemodynamically significant aortic stenosis at the infrarenal mid segment. Known Left common iliac artery occlusion. Dampened right iliac artery system with monophasic flow throughout. Note is made of small caliber abdominal aorta.   CTA abd/pelvis with bilateral runoff (07/04/14) In-stent restenosis of right common iliac artery stent. Unable to determine hemodynamic significance.  High grade left common iliac artery stenosis No significant femoral-popliteal disease with three vessel tibial runoff bilaterally.     PLAN:  Based on the patient's vascular studies and examination, and after discussing with Dr. Edilia Boickson, pt will be scheduled for CTA abd/pelvis with bilateral run off in 3-4 weeks, follow up with Dr. Edilia Boickson afterward.  I discussed in depth with the patient the nature of atherosclerosis, and emphasized the importance of maximal medical management including strict control of blood pressure, blood glucose, and lipid  levels, obtaining regular exercise, and continued cessation of smoking.  The patient is aware that without maximal medical management the underlying atherosclerotic disease process will progress, limiting the benefit of any interventions.  The patient was given information about PAD including signs, symptoms, treatment, what symptoms should prompt the patient to seek immediate medical care, and risk reduction measures to take.  Charisse MarchSuzanne Rowene Suto, RN, MSN, FNP-C [facility] [place] Office Phone: [phone]  Clinic MD: Edilia BoDickson  05/27/16 2:35 PM

## 2016-05-27 NOTE — Patient Instructions (Signed)

## 2016-06-16 ENCOUNTER — Ambulatory Visit
Admission: RE | Admit: 2016-06-16 | Discharge: 2016-06-16 | Disposition: A | Discharge status: Home / Self Care | Payer: PRIVATE HEALTH INSURANCE | Source: Ambulatory Visit | Attending: Family | Admitting: Family

## 2016-06-16 DIAGNOSIS — I745 Embolism and thrombosis of iliac artery: Secondary | ICD-10-CM

## 2016-06-16 DIAGNOSIS — Z95828 Presence of other vascular implants and grafts: Secondary | ICD-10-CM

## 2016-06-16 MED ORDER — IOPAMIDOL (ISOVUE-370) INJECTION 76%
100.0000 mL | Freq: Once | INTRAVENOUS | Status: AC | PRN
  Administered 2016-06-16: 100 mL via INTRAVENOUS

## 2016-06-24 ENCOUNTER — Encounter: Payer: Self-pay | Admitting: Vascular Surgery

## 2016-06-24 ENCOUNTER — Ambulatory Visit: Payer: PRIVATE HEALTH INSURANCE | Admitting: Vascular Surgery

## 2016-06-24 ENCOUNTER — Ambulatory Visit (INDEPENDENT_AMBULATORY_CARE_PROVIDER_SITE_OTHER): Payer: BC Managed Care – PPO | Admitting: Vascular Surgery

## 2016-06-24 VITALS — BP 167/85 | HR 90 | Temp 98.4°F | Resp 18 | Ht 61.0 in | Wt 180.0 lb

## 2016-06-24 DIAGNOSIS — I70219 Atherosclerosis of native arteries of extremities with intermittent claudication, unspecified extremity: Secondary | ICD-10-CM

## 2016-06-24 NOTE — Progress Notes (Signed)
Patient name: Heather GilfordSheila T Hart MRN: [id number] DOB: 09/20/1950 Sex: female  REASON FOR VISIT: Follow up of peripheral vascular disease.  HPI: Heather GilfordSheila T Hart is a 66 y.o. female who was seen by the nurse practitioner on 05/27/2016. The patient has a known left common iliac artery occlusion. In 2009 day had a right common iliac artery stent. The patient was set up for a CT angiogram of the abdomen and pelvis and then a follow up visit with me.  She describes a long history of bilateral lower extremity hip, thigh, and calf claudication. Her symptoms are brought on by ambulation and relieved with rest. Her symptoms are more significant on the right side. She denies any history of rest pain or history of nonhealing ulcers.  She is on aspirin. She is not a smoker.  Past Medical History:  Diagnosis Date  . Diabetes mellitus    Diet and exercise  . Hyperlipidemia   . Hypertension   . Peripheral arterial disease (HCC)   . Peripheral vascular disease (HCC)     Family History  Problem Relation Age of Onset  . Heart attack Brother   . Cancer Brother   . Hypertension Brother   . Heart disease Brother     Heart Disease before age 65  . Hypertension Mother   . Stroke Mother   . Hypertension Father   . Heart disease Father     Heart Disease before age 360  . Heart attack Father   . Cancer Sister   . Stroke Maternal Grandmother   . Cancer Paternal Grandmother   . Hypertension Son     SOCIAL HISTORY: Social History  Substance Use Topics  . Smoking status: Former Smoker    Packs/day: 0.25    Years: 20.00    Types: Cigarettes    Quit date: 11/26/1996  . Smokeless tobacco: Never Used  . Alcohol use No    No Known Allergies  Current Outpatient Prescriptions  Medication Sig Dispense Refill  . ALPRAZolam (XANAX) 1 MG tablet Take 1 mg by mouth at bedtime as needed.    Marland Kitchen. amLODipine-benazepril (LOTREL) 5-20 MG per capsule Take 1 capsule by mouth daily.    Marland Kitchen. aspirin EC 81 MG tablet  Take 81 mg by mouth daily.      Marland Kitchen. buPROPion (ZYBAN) 150 MG 12 hr tablet Take 150 mg by mouth 2 (two) times daily.      . Calcium Carbonate-Vitamin D (CALCIUM + D) 600-200 MG-UNIT per tablet Take 1 tablet by mouth 2 (two) times daily.      Marland Kitchen. glimepiride (AMARYL) 4 MG tablet   0  . hydrochlorothiazide (HYDRODIURIL) 25 MG tablet Take 25 mg by mouth daily.      . metFORMIN (GLUCOPHAGE) 500 MG tablet Take 500 mg by mouth 2 (two) times daily with a meal.    . ramipril (ALTACE) 10 MG capsule Take 10 mg by mouth daily.      . rosuvastatin (CRESTOR) 20 MG tablet Take 20 mg by mouth daily.     No current facility-administered medications for this visit.     REVIEW OF SYSTEMS:  [X]  denotes positive finding, [ ]  denotes negative finding Cardiac  Comments:  Chest pain or chest pressure:    Shortness of breath upon exertion:    Short of breath when lying flat:    Irregular heart rhythm:        Vascular    Pain in calf, thigh, or hip brought on by ambulation: X  Pain in feet at night that wakes you up from your sleep:     Blood clot in your veins:    Leg swelling:         Pulmonary    Oxygen at home:    Productive cough:     Wheezing:         Neurologic    Sudden weakness in arms or legs:     Sudden numbness in arms or legs:     Sudden onset of difficulty speaking or slurred speech:    Temporary loss of vision in one eye:     Problems with dizziness:         Gastrointestinal    Blood in stool:     Vomited blood:         Genitourinary    Burning when urinating:     Blood in urine:        Psychiatric    Major depression:         Hematologic    Bleeding problems:    Problems with blood clotting too easily:        Skin    Rashes or ulcers:        Constitutional    Fever or chills:      PHYSICAL EXAM: Vitals:   06/24/16 1513  BP: (!) 167/85  Pulse: 90  Resp: 18  Temp: 98.4 F (36.9 C)  TempSrc: Oral  SpO2: 98%  Weight: 180 lb (81.6 kg)  Height: 5\' 1"  (1.549 m)     GENERAL: The patient is a well-nourished female, in no acute distress. The vital signs are documented above. CARDIAC: There is a regular rate and rhythm.  VASCULAR: I do not detect carotid bruits. I cannot palpate femoral, popliteal, or pedal pulses bilaterally. She has no significant lower extremity swelling. PULMONARY: There is good air exchange bilaterally without wheezing or rales. ABDOMEN: Soft and non-tender with normal pitched bowel sounds.  MUSCULOSKELETAL: There are no major deformities or cyanosis. NEUROLOGIC: No focal weakness or paresthesias are detected. SKIN: There are no ulcers or rashes noted. PSYCHIATRIC: The patient has a normal affect.  DATA:   BILATERAL LOWER EXTREMITY ARTERIAL DOPPLER STUDY: I have reviewed the bilateral lower extremity arterial Doppler study that was done on 05/27/2016.  On the right side, there was a monophasic dorsalis pedis and posterior tibial signal with an ABI of 45%. Toe pressure was 63 mmHg.  On the left side there was a monophasic dorsalis pedis and posterior tibial signal with an ABI of 45%. Toe pressure was 77 mmHg.  AORTOILIAC DUPLEX: I have reviewed the aortoiliac duplex it was done on 05/27/2016. This study was not adequate as it was difficult to visualize because of overlying bowel gas. However there were severely dampened and monophasic flow noted in the right external iliac artery suggesting a proximal stenosis.  CT ANGIOGRAM AORTA WITH RUNOFF: I have reviewed the CT angiogram that was done on 06/16/2016. This shows that the distal aorta and bilateral common iliac arteries are occluded. The external iliac arteries are patent. Femoral and popliteal arteries are patent and there is three-vessel runoff bilaterally.  MEDICAL ISSUES:  DISTAL AORTIC AND BILATERAL COMMON ILIAC ARTERY OCCLUSIONS: This patient has had a known left common iliac artery occlusion and has now occluded her right common iliac artery. She has stable  claudication. Given the small size of her iliac arteries and diffuse disease I do not think she is a candidate for endovascular approach. We have discussed  conservative treatment including a structured walking program and also the importance of nutrition. The surgical options would be aortobifemoral bypass grafting or axillobifemoral bypass grafting. I am concerned about her increased risk of infection given her obesity. She is agreeable to try a conservative approach. I have ordered follow up ABIs in 6 months and I'll see her back at that time. She knows to call sooner if she has problems. Fortunately she is not a smoker.    Waverly Ferrari [facility] [place] [phone]

## 2016-06-26 NOTE — Addendum Note (Signed)
Addended by: Burton ApleyPETTY, Naveyah Iacovelli A on: 06/26/2016 04:50 PM   Modules accepted: Orders

## 2016-12-14 ENCOUNTER — Encounter: Payer: Self-pay | Admitting: Vascular Surgery

## 2016-12-23 ENCOUNTER — Encounter: Payer: Self-pay | Admitting: Vascular Surgery

## 2016-12-23 ENCOUNTER — Ambulatory Visit (HOSPITAL_COMMUNITY)
Admission: RE | Admit: 2016-12-23 | Discharge: 2016-12-23 | Disposition: A | Discharge status: Home / Self Care | Payer: BLUE CROSS/BLUE SHIELD | Source: Ambulatory Visit | Attending: Vascular Surgery | Admitting: Vascular Surgery

## 2016-12-23 ENCOUNTER — Ambulatory Visit (INDEPENDENT_AMBULATORY_CARE_PROVIDER_SITE_OTHER): Payer: BC Managed Care – PPO | Admitting: Vascular Surgery

## 2016-12-23 VITALS — BP 153/83 | HR 93 | Temp 98.8°F | Resp 20 | Ht 61.0 in | Wt 179.8 lb

## 2016-12-23 DIAGNOSIS — Z95828 Presence of other vascular implants and grafts: Secondary | ICD-10-CM | POA: Diagnosis not present

## 2016-12-23 DIAGNOSIS — I70219 Atherosclerosis of native arteries of extremities with intermittent claudication, unspecified extremity: Secondary | ICD-10-CM | POA: Diagnosis present

## 2016-12-23 DIAGNOSIS — I745 Embolism and thrombosis of iliac artery: Secondary | ICD-10-CM | POA: Diagnosis not present

## 2016-12-23 NOTE — Progress Notes (Signed)
Patient name: Heather Hart MRN: [id number] DOB: [birth year] Sex: female  REASON FOR VISIT:    Follow up of peripheral vascular disease.  HPI:   Heather Hart is a pleasant 66 y.o. female who comes in for a 6 month follow up visit. I last saw her on 06/24/2016. She has a known left common iliac artery occlusion. In 2009 she had a right common iliac artery stent placed. She did have a CT angiogram with runoff for her last visit. This showed that her distal aorta and bilateral common iliac arteries were occluded. There was no significant infrainguinal arterial occlusive disease. At that time, she had stable claudication. Given the small size of her iliac arteries and diffuse disease I did not think she was good candidate for an endovascular approach. Therefore the best option if her symptoms progressed would be aortobifemoral bypass grafting or axillobifemoral bypass grafting. Given her obesity clearly she would be at increased risk for infection. I favor a conservative approach. She comes in for six-month follow up visit.  Since I saw her last she continues to have bilateral lower extremity claudication. The location of her pain varies. For the most part, her pain most consistently involves her right hip and right buttock. Her pain is brought on by ambulation and relieved with rest. There are no other aggravating or alleviating factors. Her symptoms are more significant on the right side. She denies any history of rest pain or nonhealing ulcers.  She has a remote history of tobacco use.  Past Medical History:  Diagnosis Date  . Diabetes mellitus    Diet and exercise  . Hyperlipidemia   . Hypertension   . Peripheral arterial disease (HCC)   . Peripheral vascular disease (HCC)     Family History  Problem Relation Age of Onset  . Heart attack Brother   . Cancer Brother   . Hypertension Brother   . Heart disease Brother        Heart Disease before age 61  . Hypertension Mother   .  Stroke Mother   . Hypertension Father   . Heart disease Father        Heart Disease before age 59  . Heart attack Father   . Cancer Sister   . Stroke Maternal Grandmother   . Cancer Paternal Grandmother   . Hypertension Son     SOCIAL HISTORY: Social History  Substance Use Topics  . Smoking status: Former Smoker    Packs/day: 0.25    Years: 20.00    Types: Cigarettes    Quit date: 11/26/1996  . Smokeless tobacco: Never Used  . Alcohol use No    No Known Allergies  Current Outpatient Prescriptions  Medication Sig Dispense Refill  . ALPRAZolam (XANAX) 1 MG tablet Take 1 mg by mouth at bedtime as needed.    Marland Kitchen amLODipine-benazepril (LOTREL) 5-20 MG per capsule Take 1 capsule by mouth daily.    Marland Kitchen aspirin EC 81 MG tablet Take 81 mg by mouth daily.      Marland Kitchen buPROPion (ZYBAN) 150 MG 12 hr tablet Take 150 mg by mouth 2 (two) times daily.      . Calcium Carbonate-Vitamin D (CALCIUM + D) 600-200 MG-UNIT per tablet Take 1 tablet by mouth 2 (two) times daily.      Marland Kitchen glimepiride (AMARYL) 4 MG tablet   0  . hydrochlorothiazide (HYDRODIURIL) 25 MG tablet Take 25 mg by mouth daily.      . metFORMIN (GLUCOPHAGE) 500 MG  tablet Take 500 mg by mouth 2 (two) times daily with a meal.    . ramipril (ALTACE) 10 MG capsule Take 10 mg by mouth daily.      . rosuvastatin (CRESTOR) 20 MG tablet Take 20 mg by mouth daily.     No current facility-administered medications for this visit.     REVIEW OF SYSTEMS:  [X]  denotes positive finding, [ ]  denotes negative finding Cardiac  Comments:  Chest pain or chest pressure:    Shortness of breath upon exertion:    Short of breath when lying flat:    Irregular heart rhythm:        Vascular    Pain in calf, thigh, or hip brought on by ambulation:    Pain in feet at night that wakes you up from your sleep:     Blood clot in your veins:    Leg swelling:         Pulmonary    Oxygen at home:    Productive cough:     Wheezing:         Neurologic      Sudden weakness in arms or legs:     Sudden numbness in arms or legs:     Sudden onset of difficulty speaking or slurred speech:    Temporary loss of vision in one eye:     Problems with dizziness:         Gastrointestinal    Blood in stool:     Vomited blood:         Genitourinary    Burning when urinating:     Blood in urine:        Psychiatric    Major depression:         Hematologic    Bleeding problems:    Problems with blood clotting too easily:        Skin    Rashes or ulcers:        Constitutional    Fever or chills:     PHYSICAL EXAM:   Vitals:   12/23/16 1543  BP: (!) 153/83  Pulse: 93  Resp: 20  Temp: 98.8 F (37.1 C)  TempSrc: Oral  SpO2: 98%  Weight: 179 lb 12.8 oz (81.6 kg)  Height: 5\' 1"  (1.549 m)    GENERAL: The patient is a well-nourished female, in no acute distress. The vital signs are documented above. CARDIAC: There is a regular rate and rhythm.  VASCULAR: I do not detect carotid bruits. I cannot palpate femoral, popliteal, or pedal pulses. She has no significant lower extremity swelling. PULMONARY: There is good air exchange bilaterally without wheezing or rales. ABDOMEN: Soft and non-tender with normal pitched bowel sounds.  MUSCULOSKELETAL: There are no major deformities or cyanosis. NEUROLOGIC: No focal weakness or paresthesias are detected. SKIN: There are no ulcers or rashes noted. PSYCHIATRIC: The patient has a normal affect.  DATA:    ARTERIAL DOPPLER STUDY: I have independently interpreted her arterial Doppler study today.  On the right side, she has a monophasic dorsalis pedis and posterior tibial signal. ABI is 49%. Toe pressure on the right is 62 mmHg. Previous ABI on the right in January it was 45%. Thus this is stable.  On the left side, there is a monophasic dorsalis pedis and posterior tibial signal. ABI is 46%. Toe pressure on the left is 69 mmHg. Previous ABI on the left in January 2018 was 45%. Thus her ABI on the  left is stable.  MEDICAL ISSUES:   AORTOILIAC OCCLUSION: This patient has an occluded distal aorta and bilateral common iliac arteries. She is doing well with conservative treatment. She has a remote history of tobacco use and is not smoking. She is on a structured walking program. Given her obesity I do not think she is a good candidate for aortofemoral bypass grafting, nor do I think she is a good candidate for axillobifemoral bypass grafting. Fortunately, her symptoms have remained stable and she seems highly motivated. She has lost some weight. I plan on seeing her back in 1 year. She knows to call sooner if she has problems.  Waverly Ferrariickson, [facility] [phone]

## 2017-01-13 NOTE — Addendum Note (Signed)
Addended by: Burton Apley A on: 01/13/2017 12:21 PM   Modules accepted: Orders

## 2017-01-27 DIAGNOSIS — E1151 Type 2 diabetes mellitus with diabetic peripheral angiopathy without gangrene: Secondary | ICD-10-CM | POA: Diagnosis not present

## 2017-04-18 DIAGNOSIS — I10 Essential (primary) hypertension: Secondary | ICD-10-CM | POA: Diagnosis not present

## 2017-04-18 DIAGNOSIS — M79601 Pain in right arm: Secondary | ICD-10-CM | POA: Diagnosis not present

## 2017-05-21 ENCOUNTER — Other Ambulatory Visit: Payer: Self-pay | Admitting: Internal Medicine

## 2017-05-21 DIAGNOSIS — Z1231 Encounter for screening mammogram for malignant neoplasm of breast: Secondary | ICD-10-CM

## 2017-06-01 DIAGNOSIS — E1151 Type 2 diabetes mellitus with diabetic peripheral angiopathy without gangrene: Secondary | ICD-10-CM | POA: Diagnosis not present

## 2017-06-01 DIAGNOSIS — Z6834 Body mass index (BMI) 34.0-34.9, adult: Secondary | ICD-10-CM | POA: Diagnosis not present

## 2017-06-14 ENCOUNTER — Ambulatory Visit: Payer: BLUE CROSS/BLUE SHIELD

## 2017-07-26 ENCOUNTER — Ambulatory Visit
Admission: RE | Admit: 2017-07-26 | Discharge: 2017-07-26 | Disposition: A | Discharge status: Home / Self Care | Payer: PRIVATE HEALTH INSURANCE | Source: Ambulatory Visit | Attending: Internal Medicine | Admitting: Internal Medicine

## 2017-07-26 DIAGNOSIS — Z1231 Encounter for screening mammogram for malignant neoplasm of breast: Secondary | ICD-10-CM

## 2017-09-27 DIAGNOSIS — E1151 Type 2 diabetes mellitus with diabetic peripheral angiopathy without gangrene: Secondary | ICD-10-CM | POA: Diagnosis not present

## 2017-09-27 DIAGNOSIS — Z0000 Encounter for general adult medical examination without abnormal findings: Secondary | ICD-10-CM | POA: Diagnosis not present

## 2017-09-27 DIAGNOSIS — E785 Hyperlipidemia, unspecified: Secondary | ICD-10-CM | POA: Diagnosis not present

## 2017-09-27 DIAGNOSIS — I10 Essential (primary) hypertension: Secondary | ICD-10-CM | POA: Diagnosis not present

## 2017-10-12 DIAGNOSIS — J0110 Acute frontal sinusitis, unspecified: Secondary | ICD-10-CM | POA: Diagnosis not present

## 2017-10-21 DIAGNOSIS — Z780 Asymptomatic menopausal state: Secondary | ICD-10-CM | POA: Diagnosis not present

## 2017-10-21 DIAGNOSIS — M8588 Other specified disorders of bone density and structure, other site: Secondary | ICD-10-CM | POA: Diagnosis not present

## 2018-07-08 ENCOUNTER — Other Ambulatory Visit: Payer: Self-pay | Admitting: Internal Medicine

## 2018-07-08 DIAGNOSIS — Z1231 Encounter for screening mammogram for malignant neoplasm of breast: Secondary | ICD-10-CM

## 2018-08-15 ENCOUNTER — Ambulatory Visit: Payer: PRIVATE HEALTH INSURANCE

## 2018-09-07 ENCOUNTER — Encounter (HOSPITAL_COMMUNITY): Payer: PRIVATE HEALTH INSURANCE

## 2018-09-07 ENCOUNTER — Ambulatory Visit: Payer: PRIVATE HEALTH INSURANCE | Admitting: Vascular Surgery

## 2018-12-07 ENCOUNTER — Ambulatory Visit
Admission: RE | Admit: 2018-12-07 | Discharge: 2018-12-07 | Disposition: A | Discharge status: Home / Self Care | Payer: BC Managed Care – PPO | Source: Ambulatory Visit | Attending: Internal Medicine | Admitting: Internal Medicine

## 2018-12-07 ENCOUNTER — Other Ambulatory Visit: Payer: Self-pay

## 2018-12-07 DIAGNOSIS — Z1231 Encounter for screening mammogram for malignant neoplasm of breast: Secondary | ICD-10-CM | POA: Diagnosis not present

## 2018-12-14 ENCOUNTER — Other Ambulatory Visit: Payer: Self-pay

## 2018-12-14 DIAGNOSIS — I70219 Atherosclerosis of native arteries of extremities with intermittent claudication, unspecified extremity: Secondary | ICD-10-CM

## 2018-12-14 DIAGNOSIS — I739 Peripheral vascular disease, unspecified: Secondary | ICD-10-CM

## 2018-12-20 ENCOUNTER — Telehealth (HOSPITAL_COMMUNITY): Payer: Self-pay

## 2018-12-20 NOTE — Telephone Encounter (Signed)
Left a voicemail with COVID 19 information.

## 2018-12-21 ENCOUNTER — Other Ambulatory Visit: Payer: Self-pay

## 2018-12-21 ENCOUNTER — Ambulatory Visit (HOSPITAL_COMMUNITY)
Admission: RE | Admit: 2018-12-21 | Discharge: 2018-12-21 | Disposition: A | Discharge status: Home / Self Care | Payer: BC Managed Care – PPO | Source: Ambulatory Visit | Attending: Vascular Surgery | Admitting: Vascular Surgery

## 2018-12-21 ENCOUNTER — Encounter: Payer: Self-pay | Admitting: Vascular Surgery

## 2018-12-21 ENCOUNTER — Ambulatory Visit (INDEPENDENT_AMBULATORY_CARE_PROVIDER_SITE_OTHER): Payer: BC Managed Care – PPO | Admitting: Vascular Surgery

## 2018-12-21 VITALS — BP 167/76 | HR 70 | Temp 97.4°F | Resp 20 | Ht 61.0 in | Wt 162.9 lb

## 2018-12-21 DIAGNOSIS — I7409 Other arterial embolism and thrombosis of abdominal aorta: Secondary | ICD-10-CM | POA: Diagnosis not present

## 2018-12-21 DIAGNOSIS — I739 Peripheral vascular disease, unspecified: Secondary | ICD-10-CM | POA: Diagnosis not present

## 2018-12-21 NOTE — Progress Notes (Signed)
Patient name: Heather Hart MRN: [id number] DOB: 07/29/1950 Sex: female  REASON FOR VISIT:   Follow-up of peripheral vascular disease.  HPI:   Heather SermonSheila Teel Rickel is a pleasant 68 y.o. female who has a known aortoiliac occlusion.  Based on a CT scan patient has an occluded distal aorta and bilateral common iliac arteries.  When I saw her last she was doing well with conservative treatment.  She was on a structured walking program.  Given her obesity I did not think she was a good candidate for aortofemoral bypass grafting nor did I think she was a candidate for axillobifemoral bypass grafting.  However her symptoms were stable so we continue with conservative treatment.  At the time of her last visit ABI on the right was 49%.  ABI on the left was 46%.  Since I saw her last she really is not having any claudication.  She is able to ambulate without difficulty.  She seems very motivated.  She denies any history of rest pain or nonhealing ulcers.  She is not a smoker.  Past Medical History:  Diagnosis Date  . Diabetes mellitus    Diet and exercise  . Hyperlipidemia   . Hypertension   . Peripheral arterial disease (HCC)   . Peripheral vascular disease (HCC)     Family History  Problem Relation Age of Onset  . Heart attack Brother   . Cancer Brother   . Hypertension Brother   . Heart disease Brother        Heart Disease before age 67  . Hypertension Mother   . Stroke Mother   . Hypertension Father   . Heart disease Father        Heart Disease before age 67  . Heart attack Father   . Cancer Sister   . Stroke Maternal Grandmother   . Cancer Paternal Grandmother   . Hypertension Son   . Breast cancer Neg Hx     SOCIAL HISTORY: Social History   Tobacco Use  . Smoking status: Former Smoker    Packs/day: 0.25    Years: 20.00    Pack years: 5.00    Types: Cigarettes    Quit date: 11/26/1996    Years since quitting: 22.0  . Smokeless tobacco: Never Used  Substance  Use Topics  . Alcohol use: No    No Known Allergies  Current Outpatient Medications  Medication Sig Dispense Refill  . ALPRAZolam (XANAX) 1 MG tablet Take 1 mg by mouth at bedtime as needed.    Marland Kitchen. amLODipine-benazepril (LOTREL) 5-20 MG per capsule Take 1 capsule by mouth daily.    Marland Kitchen. aspirin EC 81 MG tablet Take 81 mg by mouth daily.      Marland Kitchen. buPROPion (ZYBAN) 150 MG 12 hr tablet Take 150 mg by mouth 2 (two) times daily.      . Calcium Carbonate-Vitamin D (CALCIUM + D) 600-200 MG-UNIT per tablet Take 1 tablet by mouth 2 (two) times daily.      Marland Kitchen. glimepiride (AMARYL) 4 MG tablet   0  . hydrochlorothiazide (HYDRODIURIL) 25 MG tablet Take 25 mg by mouth daily.      . metFORMIN (GLUCOPHAGE) 500 MG tablet Take 500 mg by mouth 2 (two) times daily with a meal.    . ramipril (ALTACE) 10 MG capsule Take 10 mg by mouth daily.      . rosuvastatin (CRESTOR) 20 MG tablet Take 20 mg by mouth daily.     No current facility-administered  medications for this visit.     REVIEW OF SYSTEMS:  [X]  denotes positive finding, [ ]  denotes negative finding Cardiac  Comments:  Chest pain or chest pressure:    Shortness of breath upon exertion:    Short of breath when lying flat:    Irregular heart rhythm:        Vascular    Pain in calf, thigh, or hip brought on by ambulation:    Pain in feet at night that wakes you up from your sleep:     Blood clot in your veins:    Leg swelling:         Pulmonary    Oxygen at home:    Productive cough:     Wheezing:         Neurologic    Sudden weakness in arms or legs:     Sudden numbness in arms or legs:     Sudden onset of difficulty speaking or slurred speech:    Temporary loss of vision in one eye:     Problems with dizziness:         Gastrointestinal    Blood in stool:     Vomited blood:         Genitourinary    Burning when urinating:     Blood in urine:        Psychiatric    Major depression:         Hematologic    Bleeding problems:     Problems with blood clotting too easily:        Skin    Rashes or ulcers:        Constitutional    Fever or chills:     PHYSICAL EXAM:   Vitals:   12/21/18 0846  BP: (!) 167/76  Pulse: 70  Resp: 20  Temp: (!) 97.4 F (36.3 C)  TempSrc: Temporal  SpO2: 98%  Weight: 162 lb 14.4 oz (73.9 kg)  Height: 5\' 1"  (1.549 m)    GENERAL: The patient is a well-nourished female, in no acute distress. The vital signs are documented above. CARDIAC: There is a regular rate and rhythm.  VASCULAR: I do not detect carotid bruits. I cannot palpate femoral, popliteal, or pedal pulses bilaterally. She has no significant lower extremity swelling. PULMONARY: There is good air exchange bilaterally without wheezing or rales. ABDOMEN: Soft and non-tender with normal pitched bowel sounds.  MUSCULOSKELETAL: There are no major deformities or cyanosis. NEUROLOGIC: No focal weakness or paresthesias are detected. SKIN: There are no ulcers or rashes noted. PSYCHIATRIC: The patient has a normal affect.  DATA:    ARTERIAL DOPPLER STUDY: I have independently interpreted her arterial Doppler study today.  On the right side there is a biphasic dorsalis pedis and posterior tibial signal with an ABI of 58%.  Toe pressure on the right is 48 mmHg.  On the left side there is a monophasic dorsalis pedis and posterior tibial signal with an ABI of 59%.  Toe pressure on the left is 71 mmHg.  MEDICAL ISSUES:   DISTAL AORTIC OCCLUSION: This patient has a chronic distal aortic occlusion and occluded common iliac arteries.  We have been treating this conservatively and her symptoms have improved and essentially resolved.  Her ABIs have improved also.  She currently is not experiencing any significant claudication or rest pain.  I encouraged her to stay as active as possible.  We discussed the importance of nutrition.  I explained that we would only  consider aggressive work-up if she developed disabling claudication, rest  pain, or nonhealing ulcer.  I will see her back as needed.  Waverly Ferrarihristopher Dickson [facility] [phone]

## 2019-03-22 ENCOUNTER — Ambulatory Visit (INDEPENDENT_AMBULATORY_CARE_PROVIDER_SITE_OTHER): Payer: BC Managed Care – PPO | Admitting: Plastic Surgery

## 2019-03-22 ENCOUNTER — Other Ambulatory Visit: Payer: Self-pay

## 2019-03-22 ENCOUNTER — Encounter: Payer: Self-pay | Admitting: Plastic Surgery

## 2019-03-22 ENCOUNTER — Institutional Professional Consult (permissible substitution): Payer: BC Managed Care – PPO | Admitting: Plastic Surgery

## 2019-03-22 VITALS — BP 187/85 | HR 88 | Temp 97.5°F | Ht 61.0 in | Wt 167.2 lb

## 2019-03-22 DIAGNOSIS — S01319A Laceration without foreign body of unspecified ear, initial encounter: Secondary | ICD-10-CM

## 2019-03-22 NOTE — Progress Notes (Signed)
Referring Provider Lorenda Ishihara, MD [address] [place],  [place] [address]   CC:  Chief Complaint  Patient presents with  . Advice Only    for (B) ear lobe repair      Heather Hart is an 68 y.o. female.  HPI: Patient is here to discuss issues with bilateral earlobes.  The left earlobe has a complete tear from an earring that occurred several months ago.  Also in the left earlobe one of the earring holes has stretched out and is a near complete tear.  The right side has 2 piercings of the earlobe 1 of which has stretched out to a near complete tear.  She says this has been like this for at least several months.  She denies previous earlobe repairs.  No Known Allergies  Outpatient Encounter Medications as of 03/22/2019  Medication Sig Note  . ALPRAZolam (XANAX) 1 MG tablet Take 1 mg by mouth at bedtime as needed.   Marland Kitchen amLODipine-benazepril (LOTREL) 5-20 MG per capsule Take 1 capsule by mouth daily.   Marland Kitchen aspirin EC 81 MG tablet Take 81 mg by mouth daily.     Marland Kitchen buPROPion (ZYBAN) 150 MG 12 hr tablet Take 150 mg by mouth 2 (two) times daily.     . Calcium Carbonate-Vitamin D (CALCIUM + D) 600-200 MG-UNIT per tablet Take 1 tablet by mouth 2 (two) times daily.     Marland Kitchen glimepiride (AMARYL) 4 MG tablet  05/27/2016: Received from: External Pharmacy  . hydrochlorothiazide (HYDRODIURIL) 25 MG tablet Take 25 mg by mouth daily.     . metFORMIN (GLUCOPHAGE) 500 MG tablet Take 500 mg by mouth 2 (two) times daily with a meal.   . ramipril (ALTACE) 10 MG capsule Take 10 mg by mouth daily.     . rosuvastatin (CRESTOR) 20 MG tablet Take 20 mg by mouth daily.    No facility-administered encounter medications on file as of 03/22/2019.      Past Medical History:  Diagnosis Date  . Diabetes mellitus    Diet and exercise  . Hyperlipidemia   . Hypertension   . Peripheral arterial disease (HCC)   . Peripheral vascular disease [facility])     Past Surgical History:  Procedure  Laterality Date  . aortogram  05/07/2008, 06/22/2005  . CESAREAN SECTION  1976  and 1979  . COLONOSCOPY WITH PROPOFOL N/A 09/25/2014   Procedure: COLONOSCOPY WITH PROPOFOL;  Surgeon: Charolett Bumpers, MD;  Location: [facility] END[facility]COPY;  Service: Endoscopy;  Laterality: N/A;  . PARTIAL HYSTERECTOMY    . stenting of right common iliac artery   05/07/2008    Family History  Problem Relation Age of Onset  . Heart attack Brother   . Cancer Brother   . Hypertension Brother   . Heart disease Brother        Heart Disease before age 35  . Hypertension Mother   . Stroke Mother   . Hypertension Father   . Heart disease Father        Heart Disease before age 75  . Heart attack Father   . Cancer Sister   . Stroke Maternal Grandmother   . Cancer Paternal Grandmother   . Hypertension Son   . Breast cancer Neg Hx     Social History   Social History Narrative  . Not on file     Review of Systems General: Denies fevers, chills, weight loss CV: Denies chest pain, shortness of breath, palpitations  Physical  Exam Vitals with BMI 03/22/2019 12/21/2018 12/23/2016  Height 5\' 1"  5\' 1"  5\' 1"   Weight 167 lbs 3 oz 162 lbs 14 oz 179 lbs 13 oz  BMI 73.22 02.5 34  Systolic 427 062 376  Diastolic 85 76 83  Pulse 88 70 93    General:  No acute distress,  Alert and oriented, Non-Toxic, Normal speech and affect HEENT: Cranial nerves grossly intact.  Extraocular is intact.  Sclera nonicteric.  She has 2 ear piercings on the left earlobe 1 of which has torn all the way through and the other nearly so.  These are chronic issues and have totally healed.  The right earlobe has 2 piercing holes 1 of which is a near complete tear.  The other hole on the right side does not appear to have stretched.  Assessment/Plan Patient is presenting with a chronic tear of the left earlobe from an earring.  She has a near complete tear on the left and a near complete tear on the right.  She is interested in having this fixed.  I  explained the details of the procedure for her which would essentially be a excision and closure.  She would then need to have sutures removed about a week and a half later and will need to avoid repiercing the ears for several months.  She is fully understanding.  I explained we would check with her insurance company to see if they covered this and if not provide her with a cosmetic quote.  She is fully understanding and will get back in touch with her.  [name] 03/22/2019, 8:54 AM

## 2019-04-05 ENCOUNTER — Ambulatory Visit (INDEPENDENT_AMBULATORY_CARE_PROVIDER_SITE_OTHER): Payer: Self-pay | Admitting: Plastic Surgery

## 2019-04-05 ENCOUNTER — Other Ambulatory Visit: Payer: Self-pay

## 2019-04-05 ENCOUNTER — Encounter: Payer: Self-pay | Admitting: Plastic Surgery

## 2019-04-05 VITALS — BP 178/91 | HR 97 | Temp 97.5°F | Ht 61.0 in | Wt 162.8 lb

## 2019-04-05 DIAGNOSIS — Z411 Encounter for cosmetic surgery: Secondary | ICD-10-CM

## 2019-04-05 NOTE — Progress Notes (Signed)
Operative Note   DATE OF OPERATION: 04/05/2019  LOCATION:    SURGICAL DEPARTMENT: Plastic Surgery  PREOPERATIVE DIAGNOSES: Bilateral split earlobes  POSTOPERATIVE DIAGNOSES:  same  PROCEDURE:  1. Excision of bilateral split earlobes with direct repair  SURGEON: Talmadge Coventry, MD  ANESTHESIA:  Local  COMPLICATIONS: None.   INDICATIONS FOR PROCEDURE:  The patient, Heather Hart is a 68 y.o. female born on Oct 20, 1950, is here for treatment of bilateral split earlobes MRN: [id number]  CONSENT:  Informed consent was obtained directly from the patient. Risks, benefits and alternatives were fully discussed. Specific risks including but not limited to bleeding, infection, hematoma, seroma, scarring, pain, infection, wound healing problems, and need for further surgery were all discussed. The patient did have an ample opportunity to have questions answered to satisfaction.   DESCRIPTION OF PROCEDURE:  Local anesthesia was administered. The patient's operative site was prepped and draped in a sterile fashion. A time out was performed and all information was confirmed to be correct.  There were 2 areas on the left ear and one area on the right ear.  Each area was excised like a wedge with a 11 blade.  Closure was then done starting at the helical rim to get this lined up and then the remainder was closed with 5-0 Prolene.  This gave a nice on table result.  The patient tolerated the procedure well.  There were no complications.

## 2019-04-13 ENCOUNTER — Encounter: Payer: Self-pay | Admitting: Plastic Surgery

## 2019-04-13 ENCOUNTER — Other Ambulatory Visit: Payer: Self-pay

## 2019-04-13 ENCOUNTER — Ambulatory Visit (INDEPENDENT_AMBULATORY_CARE_PROVIDER_SITE_OTHER): Payer: Self-pay | Admitting: Plastic Surgery

## 2019-04-13 VITALS — BP 153/83 | HR 70 | Temp 97.1°F | Ht 61.0 in | Wt 167.8 lb

## 2019-04-13 DIAGNOSIS — Z411 Encounter for cosmetic surgery: Secondary | ICD-10-CM

## 2019-04-13 DIAGNOSIS — S01319D Laceration without foreign body of unspecified ear, subsequent encounter: Secondary | ICD-10-CM

## 2019-04-13 NOTE — Progress Notes (Signed)
The patient is a 68 year old female here for follow-up after bilateral split earlobe repair on 04/05/2019 with Dr. Claudia Desanctis.  Prolene sutures removed from left ear repair.  The incision is C/D/I, no dehiscence noted.  There is some scabbing.  No drainage noted.  No erythema.  She reports she has had minimal pain and only need to take Tylenol the day after.  The right ear had a single repair, there was a little bit of an opening along the inferior aspect of the lobe.  Prolene sutures left in place along the inferior aspect to prevent any additional opening.  Follow-up after Thanksgiving for removal of additional sutures.  She is doing really well, she is happy with her progress.  She has no complaints.

## 2019-04-26 ENCOUNTER — Encounter: Payer: Self-pay | Admitting: Plastic Surgery

## 2019-04-26 ENCOUNTER — Ambulatory Visit (INDEPENDENT_AMBULATORY_CARE_PROVIDER_SITE_OTHER): Payer: BC Managed Care – PPO | Admitting: Plastic Surgery

## 2019-04-26 ENCOUNTER — Other Ambulatory Visit: Payer: Self-pay

## 2019-04-26 VITALS — BP 181/83 | HR 74 | Temp 97.1°F | Ht 61.0 in | Wt 166.0 lb

## 2019-04-26 DIAGNOSIS — Z411 Encounter for cosmetic surgery: Secondary | ICD-10-CM

## 2019-04-26 NOTE — Progress Notes (Signed)
Patient is doing well after bilateral earlobe repairs.  All of her sutures have now been removed.  She is healing nicely.  We will plan to see her in about 3 months to pierce these areas again.  All of her questions were answered and she is happy with her result.

## 2019-06-14 ENCOUNTER — Ambulatory Visit: Payer: BC Managed Care – PPO | Attending: Internal Medicine

## 2019-06-14 DIAGNOSIS — Z23 Encounter for immunization: Secondary | ICD-10-CM | POA: Diagnosis not present

## 2019-06-14 NOTE — Progress Notes (Signed)
   Covid-19 Vaccination Clinic  Name:  Heather Hart    MRN: [id number] DOB: [birth year]  06/14/2019  [name] was observed post Covid-19 immunization for 15 minutes without incidence. She was provided with Vaccine Information Sheet and instruction to access the V-Safe system.   [name] was instructed to call 911 with any severe reactions post vaccine: Marland Kitchen Difficulty breathing  . Swelling of your face and throat  . A fast heartbeat  . A bad rash all over your body  . Dizziness and weakness    Immunizations Administered    Name Date Dose VIS Date Route   Pfizer COVID-19 Vaccine 06/14/2019  6:17 PM 0.3 mL 05/05/2019 Intramuscular   Manufacturer: [employer], [employer]   Lot: [id number]   NDC: [address]-6

## 2019-06-21 DIAGNOSIS — E1151 Type 2 diabetes mellitus with diabetic peripheral angiopathy without gangrene: Secondary | ICD-10-CM | POA: Diagnosis not present

## 2019-06-21 DIAGNOSIS — E785 Hyperlipidemia, unspecified: Secondary | ICD-10-CM | POA: Diagnosis not present

## 2019-06-21 DIAGNOSIS — I10 Essential (primary) hypertension: Secondary | ICD-10-CM | POA: Diagnosis not present

## 2019-06-22 ENCOUNTER — Telehealth: Payer: Self-pay | Admitting: Plastic Surgery

## 2019-06-22 NOTE — Telephone Encounter (Signed)
Pt left voicemail yesterday to reschedule appt. Lvm for her to callback and reschedule appt.

## 2019-07-05 ENCOUNTER — Ambulatory Visit: Payer: BC Managed Care – PPO | Attending: Internal Medicine

## 2019-07-05 ENCOUNTER — Ambulatory Visit: Payer: BC Managed Care – PPO | Admitting: Plastic Surgery

## 2019-07-05 DIAGNOSIS — Z23 Encounter for immunization: Secondary | ICD-10-CM

## 2019-07-05 NOTE — Progress Notes (Signed)
   Covid-19 Vaccination Clinic  Name:  Deisha Stull    MRN: [id number] DOB: 02/18/1951  07/05/2019  Ms. Hughson was observed post Covid-19 immunization for 15 minutes without incidence. She was provided with Vaccine Information Sheet and instruction to access the V-Safe system.   Ms. Hansmann was instructed to call 911 with any severe reactions post vaccine: Marland Kitchen Difficulty breathing  . Swelling of your face and throat  . A fast heartbeat  . A bad rash all over your body  . Dizziness and weakness    Immunizations Administered    Name Date Dose VIS Date Route   Pfizer COVID-19 Vaccine 07/05/2019  8:20 AM 0.3 mL 05/05/2019 Intramuscular   Manufacturer: [employer], [employer]   Lot: [id number]   NDC: [address]-2

## 2019-07-12 ENCOUNTER — Encounter: Payer: Self-pay | Admitting: Plastic Surgery

## 2019-07-12 ENCOUNTER — Ambulatory Visit (INDEPENDENT_AMBULATORY_CARE_PROVIDER_SITE_OTHER): Payer: Self-pay | Admitting: Plastic Surgery

## 2019-07-12 ENCOUNTER — Other Ambulatory Visit: Payer: Self-pay

## 2019-07-12 VITALS — BP 153/76 | HR 91 | Temp 97.3°F | Ht 60.0 in | Wt 168.4 lb

## 2019-07-12 DIAGNOSIS — Z411 Encounter for cosmetic surgery: Secondary | ICD-10-CM

## 2019-07-12 NOTE — Progress Notes (Signed)
Patient presents for piercing after bilateral earlobe tear repairs.  She is healed up well from her earlobe repairs and is ready for piercing.  I pierced each ear in 2 positions for a total of 4 piercings.  These were done slightly off the previous piercing sites.  She tolerated this well have asked her to leave the current studies in for 3 weeks at least.

## 2019-07-14 ENCOUNTER — Telehealth: Payer: Self-pay | Admitting: Internal Medicine

## 2019-07-14 NOTE — Telephone Encounter (Signed)
LVM for patient to call appointment. 07/14/19

## 2019-08-01 NOTE — Progress Notes (Signed)
Cardiology Office Note:   Date:  08/02/2019  NAME:  Heather Hart    MRN: [id number] DOB:  1951/02/26   PCP:  Lorenda Ishihara, MD  Cardiologist:  Reatha Harps, MD   Referring MD: Lorenda Ishihara,*   Chief Complaint  Patient presents with  . Chest Pain    History of Present Illness:   Heather Hart is a 69 y.o. female with a hx of PAD, DM, HTN, HLD who is being seen today for the evaluation of chest pain at the request of Lorenda Ishihara, MD.  She reports for the last 1 month she has had episodic chest tightness at night.  She reports she gets it 3-4 times per week.  She reports that this mainly occurs when lying down.  She reports that the pain improves with repositioning.  She reports that sometimes it is worse after eating a meal late at night.  She reports that she walks up to 1/2 mile per day and has no symptoms of chest pain or shortness of breath.  Her claudication has improved considerably per her report and review of her vascular surgery notes.  She has considerable history of PAD.  Her diabetes is much better controlled with most recent A1c value 7.2.  Most recent LDL cholesterol 109 and triglycerides 213.  She is a former smoker.  Despite her significant history of PAD she has no history of myocardial infarction or stroke.  She reports that when she exerts herself she gets no chest pain or tightness.  Symptoms appear to be worsening.  She is concerned about her heart.  She does have a family history of heart disease.  All of her symptoms have coincided with blood pressure has been a little difficult to control.  She reports over the past 1 year her blood pressure has been a little elevated more than usual.  She reports she takes 4 medications but is unclear of the names or doses.  Problem List 1. PAD -chronic distal aortic occlusion/occluded common iliac arteries -managed conservatively  -R ABI 0.56, L ABI 0.56 2. Diabetes -A1c 7.2 3. HTN 4.  HLD -Total cholesterol 200, HDL 48, LDL 109, triglycerides 213  Past Medical History: Past Medical History:  Diagnosis Date  . Diabetes mellitus    Diet and exercise  . Hyperlipidemia   . Hypertension   . Peripheral arterial disease (HCC)   . Peripheral vascular disease [facility])     Past Surgical History: Past Surgical History:  Procedure Laterality Date  . aortogram  05/07/2008, 06/22/2005  . CESAREAN SECTION  1976  and 1979  . COLONOSCOPY WITH PROPOFOL N/A 09/25/2014   Procedure: COLONOSCOPY WITH PROPOFOL;  Surgeon: Charolett Bumpers, MD;  Location: [facility];  Service: Endoscopy;  Laterality: N/A;  . PARTIAL HYSTERECTOMY    . stenting of right common iliac artery   05/07/2008    Current Medications: Current Meds  Medication Sig  . ALPRAZolam (XANAX) 1 MG tablet Take 1 mg by mouth at bedtime as needed.  Marland Kitchen amLODipine (NORVASC) 10 MG tablet Take 10 mg by mouth daily.  Marland Kitchen aspirin EC 81 MG tablet Take 81 mg by mouth daily.    . benazepril (LOTENSIN) 40 MG tablet Take 40 mg by mouth daily.  . Calcium Carbonate-Vitamin D (CALCIUM + D) 600-200 MG-UNIT per tablet Take 1 tablet by mouth 2 (two) times daily.    Marland Kitchen glimepiride (AMARYL) 4 MG tablet   . hydrALAZINE (APRESOLINE) 25 MG tablet Take 25 mg by  mouth in the morning and at bedtime.  . hydrochlorothiazide (HYDRODIURIL) 25 MG tablet Take 25 mg by mouth daily.    Marland Kitchen JANUVIA 100 MG tablet Take 100 mg by mouth daily.  Marland Kitchen JARDIANCE 10 MG TABS tablet TK 1 T PO  WITH FOOD  . metFORMIN (GLUCOPHAGE) 500 MG tablet Take 500 mg by mouth 2 (two) times daily with a meal.  . ONETOUCH ULTRA test strip daily. as directed  . OZEMPIC, 0.25 OR 0.5 MG/DOSE, 2 MG/1.5ML SOPN [place] AS DIRECTED ONCE WEEKLY  . rosuvastatin (CRESTOR) 40 MG tablet Take 40 mg by mouth daily.     Allergies:    Patient has no known allergies.   Social History: Social History   Socioeconomic History  . Marital status: Widowed    Spouse name: Not on file  . Number of  children: 2  . Years of education: Not on file  . Highest education level: Not on file  Occupational History  . Not on file  Tobacco Use  . Smoking status: Former Smoker    Packs/day: 0.25    Years: 20.00    Pack years: 5.00    Types: Cigarettes    Quit date: 11/26/1996    Years since quitting: 22.6  . Smokeless tobacco: Never Used  Substance and Sexual Activity  . Alcohol use: No  . Drug use: No  . Sexual activity: Not on file  Other Topics Concern  . Not on file  Social History Narrative  . Not on file   Social Determinants of Health   Financial Resource Strain:   . Difficulty of Paying Living Expenses: Not on file  Food Insecurity:   . Worried About Programme researcher, broadcasting/film/video in the Last Year: Not on file  . Ran Out of Food in the Last Year: Not on file  Transportation Needs:   . Lack of Transportation (Medical): Not on file  . Lack of Transportation (Non-Medical): Not on file  Physical Activity:   . Days of Exercise per Week: Not on file  . Minutes of Exercise per Session: Not on file  Stress:   . Feeling of Stress : Not on file  Social Connections:   . Frequency of Communication with Friends and Family: Not on file  . Frequency of Social Gatherings with Friends and Family: Not on file  . Attends Religious Services: Not on file  . Active Member of Clubs or Organizations: Not on file  . Attends Banker Meetings: Not on file  . Marital Status: Not on file     Family History: The patient's family history includes Cancer in her brother, paternal grandmother, and sister; Heart attack in her brother and father; Heart disease in her brother and father; Hypertension in her brother, father, mother, and son; Stroke in her maternal grandmother and mother. There is no history of Breast cancer.  ROS:   All other ROS reviewed and negative. Pertinent positives noted in the HPI.     EKGs/Labs/Other Studies Reviewed:   The following studies were personally reviewed by me  today:  EKG:  EKG is ordered today.  The ekg ordered today demonstrates normal sinus rhythm, heart rate 85, LVH with repolarization abnormality, and was personally reviewed by me.   Vas [place] LE 11/2018 Right: Resting right ankle-brachial index indicates moderate right lower  extremity arterial disease. The right toe-brachial index is abnormal. RT  great toe pressure = 48 mmHg.   Left: Resting left ankle-brachial index indicates moderate left lower  extremity arterial disease. The left toe-brachial index is abnormal. LT  Great toe pressure = 71 mmHg.   Recent Labs: No results found for requested labs within last 8760 hours.   Recent Lipid Panel No results found for: CHOL, TRIG, HDL, CHOLHDL, VLDL, LDLCALC, LDLDIRECT  Physical Exam:   VS:  BP (!) 142/84   Pulse 85   Ht 5\' 1"  (1.549 m)   Wt 167 lb 9.6 oz (76 kg)   SpO2 100%   BMI 31.67 kg/m    Wt Readings from Last 3 Encounters:  08/02/19 167 lb 9.6 oz (76 kg)  07/12/19 168 lb 6.4 oz (76.4 kg)  04/26/19 166 lb (75.3 kg)    General: Well nourished, well developed, in no acute distress Heart: Atraumatic, normal size  Eyes: PEERLA, EOMI  Neck: Supple, no JVD Endocrine: No thryomegaly Cardiac: Normal S1, S2; RRR; no murmurs, rubs, or gallops Lungs: Clear to auscultation bilaterally, no wheezing, rhonchi or rales  Abd: Soft, nontender, no hepatomegaly  Ext: No edema, absent pulses in the lower extremities Musculoskeletal: No deformities, BUE and BLE strength normal and equal Skin: Warm and dry, no rashes   Neuro: Alert and oriented to person, place, time, and situation, CNII-XII grossly intact, no focal deficits  Psych: Normal mood and affect   ASSESSMENT:   Heather Hart is a 69 y.o. female who presents for the following: 1. Chest pain, unspecified type   2. Peripheral vascular disease, unspecified ([place])   3. Essential hypertension   4. Mixed hyperlipidemia     PLAN:   1. Chest pain, unspecified type -She presents  with atypical chest pain.  Reported tightness in the chest with laying down.  It apparently does improve with change in position.  Can be associated with food.  It is not exertional or alleviated with rest.  I do suspect this could be GERD related.  No EKG changes consistent with pericarditis.  She does have considerable CVD risk factors including PAD, hypertension.  EKG today shows no acute ischemic changes but she does have LVH with repolarization abnormality.  I am unable to exclude lateral wall ischemia.  We will proceed with an echocardiogram as well as Lexiscan nuclear medicine stress test.  She will remain on aspirin as well as statin medication.  She does report that her blood pressure has been a little bit elevated as of lately.  She is unclear of medications.  She will bring them back in 1 month and we will discuss further.  2. Peripheral vascular disease, unspecified ([place]) -Occluded distal aorta as well as occluded common iliac arteries.  Her ABIs are in the severe PAD range.  She surprisingly is able to walk 1/2 mile without any claudication symptoms.  She is being managed conservatively and followed by her vascular surgeon. -We will continue aspirin. -She will continue high intensity statin.  I would like to get her LDL less than 70.  She is made considerable changes in her diabetes and most recent A1c is 7.2.  We will check a fasting lipid profile the time of her stress test.  We will likely intensify therapy based on that result. -She is on GLP-1 inhibitors which have CVD benefit  3. Essential hypertension -Blood pressure is borderline today.  She will bring back her medications in 1 month and we talked.  I will make no changes to medications.  It is alarming that she is had difficult to control blood pressure over the past 1 year.  We will  need to consider renal ultrasound to exclude renal artery stenosis given her significant PAD history.  When I see her back in 1 month we can discuss this  further.  4. Mixed hyperlipidemia -Most recent LDL cholesterol 109.  Triglycerides 213.  She has made considerable changes in diabetes.  We will check a fasting lipid profile to determine if we need to intensify therapy.   Disposition: Return in about 1 month (around 09/02/2019).  Medication Adjustments/Labs and Tests Ordered: Current medicines are reviewed at length with the patient today.  Concerns regarding medicines are outlined above.  Orders Placed This Encounter  Procedures  . Lipid panel  . MYOCARDIAL PERFUSION IMAGING  . EKG 12-Lead  . ECHOCARDIOGRAM COMPLETE   No orders of the defined types were placed in this encounter.   Patient Instructions  Medication Instructions:  The current medical regimen is effective;  continue present plan and medications.  *If you need a refill on your cardiac medications before your next appointment, please call your pharmacy*   Lab Work: LIPID (fasting labs) on the day of stress test.  If you have labs (blood work) drawn today and your tests are completely normal, you will receive your results only by: Marland Kitchen MyChart Message (if you have MyChart) OR . A paper copy in the mail If you have any lab test that is abnormal or we need to change your treatment, we will call you to review the results.   Testing/Procedures: Echocardiogram - Your physician has requested that you have an echocardiogram. Echocardiography is a painless test that uses sound waves to create images of your heart. It provides your doctor with information about the size and shape of your heart and how well your heart's chambers and valves are working. This procedure takes approximately one hour. There are no restrictions for this procedure. This will be performed at our [facility] location - [address].  Your physician has requested that you have a lexiscan myoview. A cardiac stress test is a cardiological test that measures the heart's ability to respond to  external stress in a controlled clinical environment. The stress response is induced by intravenous pharmacological stimulation.    Follow-Up: At [facility] you and your health needs are our priority.  As part of our continuing mission to provide you with exceptional heart care, we have created designated Provider Care Teams.  These Care Teams include your primary Cardiologist (physician) and Advanced Practice Providers (APPs -  Physician Assistants and Nurse Practitioners) who all work together to provide you with the care you need, when you need it.  We recommend signing up for the patient portal called "MyChart".  Sign up information is provided on this After Visit Summary.  MyChart is used to connect with patients for Virtual Visits (Telemedicine).  Patients are able to view lab/test results, encounter notes, upcoming appointments, etc.  Non-urgent messages can be sent to your provider as well.   To learn more about what you can do with MyChart, go to [email or web address].    Your next appointment:   1 month(s)  The format for your next appointment:   In Person  Provider:   Lennie Odor, MD       Signed, Lenna Gilford. Flora Lipps, MD [facility]  [address], [address] [place], [place] [address] [phone]  08/02/2019 11:15 AM

## 2019-08-02 ENCOUNTER — Ambulatory Visit (INDEPENDENT_AMBULATORY_CARE_PROVIDER_SITE_OTHER): Payer: BC Managed Care – PPO | Admitting: Cardiovascular Disease

## 2019-08-02 ENCOUNTER — Encounter: Payer: Self-pay | Admitting: Cardiovascular Disease

## 2019-08-02 ENCOUNTER — Other Ambulatory Visit: Payer: Self-pay

## 2019-08-02 VITALS — BP 142/84 | HR 85 | Ht 61.0 in | Wt 167.6 lb

## 2019-08-02 DIAGNOSIS — I739 Peripheral vascular disease, unspecified: Secondary | ICD-10-CM

## 2019-08-02 DIAGNOSIS — E782 Mixed hyperlipidemia: Secondary | ICD-10-CM

## 2019-08-02 DIAGNOSIS — I10 Essential (primary) hypertension: Secondary | ICD-10-CM

## 2019-08-02 DIAGNOSIS — R079 Chest pain, unspecified: Secondary | ICD-10-CM

## 2019-08-02 NOTE — Patient Instructions (Signed)
Medication Instructions:  The current medical regimen is effective;  continue present plan and medications.  *If you need a refill on your cardiac medications before your next appointment, please call your pharmacy*   Lab Work: LIPID (fasting labs) on the day of stress test.  If you have labs (blood work) drawn today and your tests are completely normal, you will receive your results only by: Marland Kitchen MyChart Message (if you have MyChart) OR . A paper copy in the mail If you have any lab test that is abnormal or we need to change your treatment, we will call you to review the results.   Testing/Procedures: Echocardiogram - Your physician has requested that you have an echocardiogram. Echocardiography is a painless test that uses sound waves to create images of your heart. It provides your doctor with information about the size and shape of your heart and how well your heart's chambers and valves are working. This procedure takes approximately one hour. There are no restrictions for this procedure. This will be performed at our [facility] location - [address], [address].  Your physician has requested that you have a lexiscan myoview. A cardiac stress test is a cardiological test that measures the heart's ability to respond to external stress in a controlled clinical environment. The stress response is induced by intravenous pharmacological stimulation.    Follow-Up: At [facility], you and your health needs are our priority.  As part of our continuing mission to provide you with exceptional heart care, we have created designated Provider Care Teams.  These Care Teams include your primary Cardiologist (physician) and Advanced Practice Providers (APPs -  Physician Assistants and Nurse Practitioners) who all work together to provide you with the care you need, when you need it.  We recommend signing up for the patient portal called "MyChart".  Sign up information is provided on this After Visit  Summary.  MyChart is used to connect with patients for Virtual Visits (Telemedicine).  Patients are able to view lab/test results, encounter notes, upcoming appointments, etc.  Non-urgent messages can be sent to your provider as well.   To learn more about what you can do with MyChart, go to [email or web address].    Your next appointment:   1 month(s)  The format for your next appointment:   In Person  Provider:   Lennie Odor, MD

## 2019-08-11 ENCOUNTER — Telehealth (HOSPITAL_COMMUNITY): Payer: Self-pay

## 2019-08-11 NOTE — Telephone Encounter (Signed)
Encounter complete. 

## 2019-08-15 ENCOUNTER — Telehealth (HOSPITAL_COMMUNITY): Payer: Self-pay

## 2019-08-15 NOTE — Telephone Encounter (Signed)
Encounter complete. 

## 2019-08-16 ENCOUNTER — Other Ambulatory Visit: Payer: Self-pay

## 2019-08-16 ENCOUNTER — Ambulatory Visit (HOSPITAL_COMMUNITY)
Admission: RE | Admit: 2019-08-16 | Discharge: 2019-08-16 | Disposition: A | Discharge status: Home / Self Care | Payer: BC Managed Care – PPO | Source: Ambulatory Visit | Attending: Cardiology | Admitting: Cardiology

## 2019-08-16 DIAGNOSIS — R079 Chest pain, unspecified: Secondary | ICD-10-CM

## 2019-08-16 LAB — MYOCARDIAL PERFUSION IMAGING
LV dias vol: 81 mL (ref 46–106)
LV sys vol: 34 mL
Peak HR: 113 {beats}/min
Rest HR: 74 {beats}/min
SDS: 0
SRS: 1
SSS: 1
TID: 1.07

## 2019-08-16 MED ORDER — REGADENOSON 0.4 MG/5ML IV SOLN
0.4000 mg | Freq: Once | INTRAVENOUS | Status: AC
  Administered 2019-08-16: 10:00:00 0.4 mg via INTRAVENOUS

## 2019-08-16 MED ORDER — TECHNETIUM TC 99M TETROFOSMIN IV KIT
30.8000 | PACK | Freq: Once | INTRAVENOUS | Status: AC | PRN
  Administered 2019-08-16: 30.8 via INTRAVENOUS
  Filled 2019-08-16: qty 31

## 2019-08-16 MED ORDER — TECHNETIUM TC 99M TETROFOSMIN IV KIT
10.1000 | PACK | Freq: Once | INTRAVENOUS | Status: AC | PRN
  Administered 2019-08-16: 10.1 via INTRAVENOUS
  Filled 2019-08-16: qty 11

## 2019-08-18 ENCOUNTER — Other Ambulatory Visit: Payer: Self-pay

## 2019-08-18 ENCOUNTER — Ambulatory Visit (HOSPITAL_COMMUNITY): Payer: BC Managed Care – PPO | Attending: Cardiology

## 2019-08-18 DIAGNOSIS — R079 Chest pain, unspecified: Secondary | ICD-10-CM | POA: Insufficient documentation

## 2019-08-29 DIAGNOSIS — E782 Mixed hyperlipidemia: Secondary | ICD-10-CM | POA: Diagnosis not present

## 2019-08-29 LAB — LIPID PANEL
Chol/HDL Ratio: 3.4 ratio (ref 0.0–4.4)
Cholesterol, Total: 161 mg/dL (ref 100–199)
HDL: 47 mg/dL (ref >39)
LDL Chol Calc (NIH): 73 mg/dL (ref 0–99)
Triglycerides: 256 mg/dL — ABNORMAL HIGH (ref 0–149)
VLDL Cholesterol Cal: 41 mg/dL — ABNORMAL HIGH (ref 5–40)

## 2019-08-29 NOTE — Progress Notes (Signed)
Cardiology Office Note:   Date:  08/30/2019  NAME:  Heather Hart    MRN: [id number] DOB:  12/14/1950   PCP:  Lorenda Ishihara, MD  Cardiologist:  Reatha Harps, MD   Referring MD: Lorenda Ishihara,*   Chief Complaint  Patient presents with   Follow-up   History of Present Illness:   Heather Hart is a 69 y.o. female with a hx of PAD, DM, HLD, HTN who presents for for follow-up of chest pain. Follow-up of chest tightness at night. Worse with laying down and with meals. None with exertion. NM Stress normal. Normal LVEF. Triglycerides 256.  She reports she has been doing well.  Her chest tightness has resolved.  She has modified her diet and avoiding certain foods.  I suspect this was all GERD related.  Her blood pressure today is elevated 162/86.  She reports she checks it periodically at home and the values are elevated as well.  She is on amlodipine, benazepril, HCTZ, hydralazine.  She apparently takes these medications throughout the day and an odd order.  I did instruct her that she can take them all at the same time it will be okay.  Her diabetes under well control.  Her recent lipid profile showed a elevated triglyceride of 256 but she was not fasting.  She will need to have this rechecked.  She denies any chest pain or shortness of breath today.  She does report leg pain after she walks about 50 yards.  She can stop and then resume activity once the pain resolves.  Overall she seems to be doing well.  Problem List 1. PAD -chronic distal aortic occlusion/occluded common iliac arteries -managed conservatively  -R ABI 0.56, L ABI 0.56 2. Diabetes -A1c 7.2 3. HTN 4. HLD -Total cholesterol 161, HDL 47, LDL 73, triglycerides 256  Past Medical History: Past Medical History:  Diagnosis Date   Diabetes mellitus    Diet and exercise   Hyperlipidemia    Hypertension    Peripheral arterial disease (HCC)    Peripheral vascular disease (HCC)     Past  Surgical History: Past Surgical History:  Procedure Laterality Date   aortogram  05/07/2008, 06/22/2005   CESAREAN SECTION  1976  and 1979   COLONOSCOPY WITH PROPOFOL N/A 09/25/2014   Procedure: COLONOSCOPY WITH PROPOFOL;  Surgeon: Charolett Bumpers, MD;  Location: [facility];  Service: Endoscopy;  Laterality: N/A;   PARTIAL HYSTERECTOMY     stenting of right common iliac artery   05/07/2008    Current Medications: Current Meds  Medication Sig   ALPRAZolam (XANAX) 1 MG tablet Take 1 mg by mouth at bedtime as needed.   aspirin EC 81 MG tablet Take 81 mg by mouth daily.     benazepril (LOTENSIN) 40 MG tablet Take 40 mg by mouth daily.   Calcium Carbonate-Vitamin D (CALCIUM + D) 600-200 MG-UNIT per tablet Take 1 tablet by mouth 2 (two) times daily.     glimepiride (AMARYL) 4 MG tablet    hydrALAZINE (APRESOLINE) 25 MG tablet Take 25 mg by mouth in the morning and at bedtime.   hydrochlorothiazide (HYDRODIURIL) 25 MG tablet Take 25 mg by mouth daily.     JANUVIA 100 MG tablet Take 100 mg by mouth daily.   JARDIANCE 10 MG TABS tablet TK 1 T PO  WITH FOOD   metFORMIN (GLUCOPHAGE) 500 MG tablet Take 500 mg by mouth 2 (two) times daily with a meal.   ONETOUCH ULTRA test  strip daily. as directed   OZEMPIC, 0.25 OR 0.5 MG/DOSE, 2 MG/1.5ML SOPN [place] AS DIRECTED ONCE WEEKLY   rosuvastatin (CRESTOR) 40 MG tablet Take 40 mg by mouth daily.   [DISCONTINUED] amLODipine (NORVASC) 10 MG tablet Take 10 mg by mouth daily.     Allergies:    Patient has no known allergies.   Social History: Social History   Socioeconomic History   Marital status: Widowed    Spouse name: Not on file   Number of children: 2   Years of education: Not on file   Highest education level: Not on file  Occupational History   Not on file  Tobacco Use   Smoking status: Former Smoker    Packs/day: 0.25    Years: 20.00    Pack years: 5.00    Types: Cigarettes    Quit date: 11/26/1996    Years  since quitting: 22.7   Smokeless tobacco: Never Used  Substance and Sexual Activity   Alcohol use: No   Drug use: No   Sexual activity: Not on file  Other Topics Concern   Not on file  Social History Narrative   Not on file   Social Determinants of Health   Financial Resource Strain:    Difficulty of Paying Living Expenses:   Food Insecurity:    Worried About Programme researcher, broadcasting/film/video in the Last Year:    Barista in the Last Year:   Transportation Needs:    Freight forwarder (Medical):    Lack of Transportation (Non-Medical):   Physical Activity:    Days of Exercise per Week:    Minutes of Exercise per Session:   Stress:    Feeling of Stress :   Social Connections:    Frequency of Communication with Friends and Family:    Frequency of Social Gatherings with Friends and Family:    Attends Religious Services:    Active Member of Clubs or Organizations:    Attends Banker Meetings:    Marital Status:      Family History: The patient's  family history includes Cancer in her brother, paternal grandmother, and sister; Heart attack in her brother and father; Heart disease in her brother and father; Hypertension in her brother, father, mother, and son; Stroke in her maternal grandmother and mother. There is no history of Breast cancer.  ROS:   All other ROS reviewed and negative. Pertinent positives noted in the HPI.     EKGs/Labs/Other Studies Reviewed:   The following studies were personally reviewed by me today:  NM Stress 08/16/2019  The left ventricular ejection fraction is normal (55-65%).  Nuclear stress EF: 58%.  There was no ST segment deviation noted during stress.  The study is normal.  This is a low risk study.  Patient was extremely hypertensive prior to testing and throughout the study.  TTE 08/18/2019 1. Left ventricular ejection fraction, by estimation, is 60 to 65%. The  left ventricle has normal function. The  left ventricle has no regional  wall motion abnormalities. Left ventricular diastolic parameters are  consistent with Grade I diastolic  dysfunction (impaired relaxation). Elevated left ventricular end-diastolic  pressure.  2. Right ventricular systolic function is normal. The right ventricular  size is normal. Tricuspid regurgitation signal is inadequate for assessing  PA pressure.  3. The mitral valve is normal in structure. Mild mitral valve  regurgitation. No evidence of mitral stenosis.  4. The aortic valve is tricuspid. Aortic valve regurgitation  is mild.  Mild aortic valve sclerosis is present, with no evidence of aortic valve  stenosis.  5. The inferior vena cava is normal in size with greater than 50%  respiratory variability, suggesting right atrial pressure of 3 mmHg.   Recent Labs: No results found for requested labs within last 8760 hours.   Recent Lipid Panel    Component Value Date/Time   CHOL 161 08/29/2019 1036   TRIG 256 (H) 08/29/2019 1036   HDL 47 08/29/2019 1036   CHOLHDL 3.4 08/29/2019 1036   LDLCALC 73 08/29/2019 1036    Physical Exam:   VS:  BP (!) 162/86    Pulse 91    Ht 5\' 1"  (1.549 m)    Wt 170 lb 3.2 oz (77.2 kg)    SpO2 95%    BMI 32.16 kg/m    Wt Readings from Last 3 Encounters:  08/30/19 170 lb 3.2 oz (77.2 kg)  08/16/19 167 lb (75.8 kg)  08/02/19 167 lb 9.6 oz (76 kg)    General: Well nourished, well developed, in no acute distress Heart: Atraumatic, normal size  Eyes: PEERLA, EOMI  Neck: Supple, no JVD Endocrine: No thryomegaly Cardiac: Normal S1, S2; RRR; no murmurs, rubs, or gallops Lungs: Clear to auscultation bilaterally, no wheezing, rhonchi or rales  Abd: Soft, nontender, no hepatomegaly  Ext: No edema, absent pulses bilaterally  Musculoskeletal: No deformities, BUE and BLE strength normal and equal Skin: Warm and dry, no rashes   Neuro: Alert and oriented to person, place, time, and situation, CNII-XII grossly intact, no  focal deficits  Psych: Normal mood and affect   ASSESSMENT:   Heather Hart is a 69 y.o. female who presents for the following: 1. Precordial pain   2. Mixed hyperlipidemia   3. Essential hypertension   4. Peripheral vascular disease, unspecified ([place])     PLAN:   1. Precordial pain -normal echo and normal MPI -suspect GERD related   2. Mixed hyperlipidemia -most recent profile was not fasting. Will repeat this week -if TG elevated may need vascepa -continue crestor for now  3. Essential hypertension -BP still elevated. Taking meds at different times during day.  -will stop norvasc and add nifedipine XR 90 mg QD -continue benazepril 40 mg QD, HCTZ 25 QD, hydralazine 25 mg BID -she will keep log and take all meds in the AM  4. Peripheral vascular disease, unspecified ([place]) -bilateral occluded iliacs -doing well and minimal claudication symptoms -ASA/statin for now -conservative management per vascular surgery    Disposition: Return in about 2 months (around 10/30/2019).  Medication Adjustments/Labs and Tests Ordered: Current medicines are reviewed at length with the patient today.  Concerns regarding medicines are outlined above.  Orders Placed This Encounter  Procedures   LDL cholesterol, direct   Lipid panel   Meds ordered this encounter  Medications   NIFEdipine (PROCARDIA XL/NIFEDICAL-XL) 90 MG 24 hr tablet    Sig: Take 1 tablet (90 mg total) by mouth daily.    Dispense:  90 tablet    Refill:  1    Patient Instructions  Medication Instructions:  Stop Amlodipine Start Nifedipine 90 mg daily  *If you need a refill on your cardiac medications before your next appointment, please call your pharmacy*   Lab Work: Fasting Lipid and Direct LDL  Attached are the lab orders that are needed before your upcoming appointment, please come in anytime to have your labs drawn.   They are fasting labs, so nothing to eat  or drink after midnight.  Lab hours:  8:00-4:00 lunch hours 12:45-1:45   If you have labs (blood work) drawn today and your tests are completely normal, you will receive your results only by:  MyChart Message (if you have MyChart) OR  A paper copy in the mail If you have any lab test that is abnormal or we need to change your treatment, we will call you to review the results.   Follow-Up: At [facility], you and your health needs are our priority.  As part of our continuing mission to provide you with exceptional heart care, we have created designated Provider Care Teams.  These Care Teams include your primary Cardiologist (physician) and Advanced Practice Providers (APPs -  Physician Assistants and Nurse Practitioners) who all work together to provide you with the care you need, when you need it.  We recommend signing up for the patient portal called "MyChart".  Sign up information is provided on this After Visit Summary.  MyChart is used to connect with patients for Virtual Visits (Telemedicine).  Patients are able to view lab/test results, encounter notes, upcoming appointments, etc.  Non-urgent messages can be sent to your provider as well.   To learn more about what you can do with MyChart, go to [email or web address].    Your next appointment:   2 month(s)  The format for your next appointment:   In Person  Provider:   Lennie Odor, MD   Other Instructions Monitor your blood pressure- send [place] a Mychart message or call with blood pressure log-   Time Spent with Patient: I have spent a total of 35 minutes with patient reviewing hospital notes, telemetry, EKGs, labs and examining the patient as well as establishing an assessment and plan that was discussed with the patient.  > 50% of time was spent in direct patient care.  Signed, Lenna Gilford. Flora Lipps, MD [facility]  [address] [place], [place] [address] [phone]  08/30/2019 8:22 AM

## 2019-08-30 ENCOUNTER — Ambulatory Visit (INDEPENDENT_AMBULATORY_CARE_PROVIDER_SITE_OTHER): Payer: BC Managed Care – PPO | Admitting: Cardiovascular Disease

## 2019-08-30 ENCOUNTER — Other Ambulatory Visit: Payer: Self-pay

## 2019-08-30 ENCOUNTER — Encounter: Payer: Self-pay | Admitting: Cardiovascular Disease

## 2019-08-30 VITALS — BP 162/86 | HR 91 | Ht 61.0 in | Wt 170.2 lb

## 2019-08-30 DIAGNOSIS — I739 Peripheral vascular disease, unspecified: Secondary | ICD-10-CM

## 2019-08-30 DIAGNOSIS — I10 Essential (primary) hypertension: Secondary | ICD-10-CM | POA: Diagnosis not present

## 2019-08-30 DIAGNOSIS — R072 Precordial pain: Secondary | ICD-10-CM

## 2019-08-30 DIAGNOSIS — E782 Mixed hyperlipidemia: Secondary | ICD-10-CM

## 2019-08-30 MED ORDER — NIFEDIPINE ER OSMOTIC RELEASE 90 MG PO TB24: 90.0000 mg | ORAL_TABLET | Freq: Every day | ORAL | 1 refills | Status: DC

## 2019-08-30 NOTE — Patient Instructions (Addendum)
Medication Instructions:  Stop Amlodipine Start Nifedipine 90 mg daily  *If you need a refill on your cardiac medications before your next appointment, please call your pharmacy*   Lab Work: Fasting Lipid and Direct LDL  Attached are the lab orders that are needed before your upcoming appointment, please come in anytime to have your labs drawn.   They are fasting labs, so nothing to eat or drink after midnight.  Lab hours: 8:00-4:00 lunch hours 12:45-1:45   If you have labs (blood work) drawn today and your tests are completely normal, you will receive your results only by: Marland Kitchen MyChart Message (if you have MyChart) OR . A paper copy in the mail If you have any lab test that is abnormal or we need to change your treatment, we will call you to review the results.   Follow-Up: At [facility], you and your health needs are our priority.  As part of our continuing mission to provide you with exceptional heart care, we have created designated Provider Care Teams.  These Care Teams include your primary Cardiologist (physician) and Advanced Practice Providers (APPs -  Physician Assistants and Nurse Practitioners) who all work together to provide you with the care you need, when you need it.  We recommend signing up for the patient portal called "MyChart".  Sign up information is provided on this After Visit Summary.  MyChart is used to connect with patients for Virtual Visits (Telemedicine).  Patients are able to view lab/test results, encounter notes, upcoming appointments, etc.  Non-urgent messages can be sent to your provider as well.   To learn more about what you can do with MyChart, go to [email or web address].    Your next appointment:   2 month(s)  The format for your next appointment:   In Person  Provider:   Lennie Odor, MD   Other Instructions Monitor your blood pressure- send [place] a Mychart message or call with blood pressure log-

## 2019-08-31 ENCOUNTER — Other Ambulatory Visit: Payer: Self-pay | Admitting: Cardiovascular Disease

## 2019-08-31 DIAGNOSIS — E782 Mixed hyperlipidemia: Secondary | ICD-10-CM | POA: Diagnosis not present

## 2019-08-31 LAB — LIPID PANEL
Chol/HDL Ratio: 2.7 ratio (ref 0.0–4.4)
Cholesterol, Total: 139 mg/dL (ref 100–199)
HDL: 52 mg/dL (ref >39)
LDL Chol Calc (NIH): 73 mg/dL (ref 0–99)
Triglycerides: 68 mg/dL (ref 0–149)
VLDL Cholesterol Cal: 14 mg/dL (ref 5–40)

## 2019-08-31 LAB — LDL CHOLESTEROL, DIRECT: LDL Direct: 82 mg/dL (ref 0–99)

## 2019-08-31 NOTE — Telephone Encounter (Signed)
*  STAT* If patient is at the pharmacy, call can be transferred to refill team.   1. Which medications need to be refilled? (please list name of each medication and dose if known) NIFEdipine (PROCARDIA XL/NIFEDICAL-XL) 90 MG 24 hr tablet  2. Which pharmacy/location (including street and city if local pharmacy) is medication to be sent to? [facility] [phone] - [place], [place] - [address]  3. Do they need a 30 day or 90 day supply? 30 day  Patient states the pharmacy has not received prescription

## 2019-09-01 MED ORDER — NIFEDIPINE ER OSMOTIC RELEASE 90 MG PO TB24: 90.0000 mg | ORAL_TABLET | Freq: Every day | ORAL | 3 refills | Status: AC

## 2019-09-01 NOTE — Telephone Encounter (Signed)
Follow up  Pt calling to follow up prescription. She said [facility] was sent to [facility], she has new pharmacy all prescription need to be sent at  [facility] [phone] - [place], [place] - [address]

## 2019-09-10 ENCOUNTER — Encounter (HOSPITAL_BASED_OUTPATIENT_CLINIC_OR_DEPARTMENT_OTHER): Payer: Self-pay | Admitting: Emergency Medicine

## 2019-09-10 ENCOUNTER — Emergency Department (HOSPITAL_BASED_OUTPATIENT_CLINIC_OR_DEPARTMENT_OTHER)
Admission: EM | Admit: 2019-09-10 | Discharge: 2019-09-10 | Disposition: A | Discharge status: Home / Self Care | Payer: BC Managed Care – PPO | Attending: Emergency Medicine | Admitting: Emergency Medicine

## 2019-09-10 ENCOUNTER — Other Ambulatory Visit: Payer: Self-pay

## 2019-09-10 DIAGNOSIS — K6289 Other specified diseases of anus and rectum: Secondary | ICD-10-CM | POA: Diagnosis not present

## 2019-09-10 DIAGNOSIS — Z87891 Personal history of nicotine dependence: Secondary | ICD-10-CM | POA: Insufficient documentation

## 2019-09-10 DIAGNOSIS — I10 Essential (primary) hypertension: Secondary | ICD-10-CM | POA: Diagnosis not present

## 2019-09-10 DIAGNOSIS — E785 Hyperlipidemia, unspecified: Secondary | ICD-10-CM | POA: Diagnosis not present

## 2019-09-10 DIAGNOSIS — K649 Unspecified hemorrhoids: Secondary | ICD-10-CM | POA: Diagnosis not present

## 2019-09-10 DIAGNOSIS — Z79899 Other long term (current) drug therapy: Secondary | ICD-10-CM | POA: Insufficient documentation

## 2019-09-10 DIAGNOSIS — E119 Type 2 diabetes mellitus without complications: Secondary | ICD-10-CM | POA: Insufficient documentation

## 2019-09-10 DIAGNOSIS — Z7984 Long term (current) use of oral hypoglycemic drugs: Secondary | ICD-10-CM | POA: Diagnosis not present

## 2019-09-10 DIAGNOSIS — Z7982 Long term (current) use of aspirin: Secondary | ICD-10-CM | POA: Diagnosis not present

## 2019-09-10 MED ORDER — HYDROCORTISONE ACETATE 25 MG RE SUPP: 25.0000 mg | Freq: Two times a day (BID) | RECTAL | 0 refills | Status: DC

## 2019-09-10 NOTE — ED Triage Notes (Signed)
Pt c/o rectal "burning" that pt reports started yesterday. Pt reports constipation since Wednesday, only passing small amounts of stool, wiping streaks of blood. Pt denies fever, denies abdominal pain

## 2019-09-10 NOTE — ED Provider Notes (Signed)
[facility] EMERGENCY DEPARTMENT Provider Note   CSN: [id number] Arrival date & time: 09/10/19  1410     History No chief complaint on file.   Heather Hart is a 68 y.o. female.  69 year old female presents with complaint of rectal pain with pink streaks on the toilet paper for the past 2 days.  Symptoms started after passing small formed stools.  Denies abdominal pain, fevers.  Colonoscopy is up-to-date, no history of anal fissure or hemorrhoids.  Not anticoagulated.  No other complaints or concerns.        Past Medical History:  Diagnosis Date  . Diabetes mellitus    Diet and exercise  . Hyperlipidemia   . Hypertension   . Peripheral arterial disease ([place])   . Peripheral vascular disease [facility])     Patient Active Problem List   Diagnosis Date Noted  . Essential hypertension 11/16/2014  . Hyperlipemia 11/16/2014  . Diabetes mellitus ([place]) 11/16/2014  . PAD (peripheral artery disease) ([place]) 05/24/2013  . Aftercare following surgery of the circulatory system, [place] 05/26/2012  . Peripheral vascular disease, unspecified ([place]) 05/26/2012    Past Surgical History:  Procedure Laterality Date  . aortogram  05/07/2008, 06/22/2005  . [place]  . COLONOSCOPY WITH PROPOFOL N/A 09/25/2014   Procedure: COLONOSCOPY WITH PROPOFOL;  Surgeon: Garlan Fair, MD;  Location: [facility];  Service: Endoscopy;  Laterality: N/A;  . PARTIAL HYSTERECTOMY    . stenting of right common iliac artery   05/07/2008     OB History   No obstetric history on file.     Family History  Problem Relation Age of Onset  . Heart attack Brother   . Cancer Brother   . Hypertension Brother   . Heart disease Brother        Heart Disease before age 67  . Hypertension Mother   . Stroke Mother   . Hypertension Father   . Heart disease Father        Heart Disease before age 5  . Heart attack Father   . Cancer Sister   . Stroke Maternal Grandmother   .  Cancer Paternal Grandmother   . Hypertension Son   . Breast cancer Neg Hx     Social History   Tobacco Use  . Smoking status: Former Smoker    Packs/day: 0.25    Years: 20.00    Pack years: 5.00    Types: Cigarettes    Quit date: 11/26/1996    Years since quitting: 22.8  . Smokeless tobacco: Never Used  Substance Use Topics  . Alcohol use: Not Currently  . Drug use: No    Home Medications Prior to Admission medications   Medication Sig Start Date End Date Taking? Authorizing Provider  ALPRAZolam Duanne Moron) 1 MG tablet Take 1 mg by mouth at bedtime as needed.    [provider]  aspirin EC 81 MG tablet Take 81 mg by mouth daily.      [provider]  benazepril (LOTENSIN) 40 MG tablet Take 40 mg by mouth daily. 03/21/19   [provider]  Calcium Carbonate-Vitamin D (CALCIUM + D) 600-200 MG-UNIT per tablet Take 1 tablet by mouth 2 (two) times daily.      [provider]  glimepiride (AMARYL) 4 MG tablet  04/29/16   [provider]  hydrALAZINE (APRESOLINE) 25 MG tablet Take 25 mg by mouth in the morning and at bedtime.    [provider]  hydrochlorothiazide (HYDRODIURIL) 25 MG tablet Take 25 mg by mouth daily.      [provider]  hydrocortisone (ANUSOL-HC) 25 MG suppository Place 1 suppository (25 mg total) rectally 2 (two) times daily. 09/10/19   Jeannie Fend, PA-C  JANUVIA 100 MG tablet Take 100 mg by mouth daily. 02/16/19   [provider]  JARDIANCE 10 MG TABS tablet TK 1 T PO  WITH FOOD 03/23/19   [provider]  metFORMIN (GLUCOPHAGE) 500 MG tablet Take 500 mg by mouth 2 (two) times daily with a meal.    [provider]  NIFEdipine (PROCARDIA XL/NIFEDICAL-XL) 90 MG 24 hr tablet Take 1 tablet (90 mg total) by mouth daily. 09/01/19   O'Neal, Ronnald Ramp, MD  [facility] ULTRA test strip daily. as directed 03/27/19   [provider]  OZEMPIC, 0.25 OR 0.5 MG/DOSE, 2 MG/1.5ML SOPN [place] AS  DIRECTED ONCE WEEKLY 03/22/19   [provider]  ramipril (ALTACE) 10 MG capsule Take 10 mg by mouth daily.      [provider]  rosuvastatin (CRESTOR) 40 MG tablet Take 40 mg by mouth daily. 02/15/19   [provider]    Allergies    Patient has no known allergies.  Review of Systems   Review of Systems  Constitutional: Negative for fever.  Gastrointestinal: Positive for anal bleeding. Negative for abdominal pain and blood in stool.  Skin: Negative for wound.  Hematological: Does not bruise/bleed easily.    Physical Exam Updated Vital Signs BP (!) 144/78 (BP Location: Right Arm)   Pulse 95   Temp 98.5 F (36.9 C) (Oral)   Resp 18   Ht 5\' 1"  (1.549 m)   Wt 77.1 kg   SpO2 99%   BMI 32.12 kg/m   Physical Exam Vitals and nursing note reviewed. Exam conducted with a chaperone present.  Constitutional:      General: She is not in acute distress.    Appearance: She is well-developed. She is not diaphoretic.  HENT:     Head: Normocephalic and atraumatic.  Pulmonary:     Effort: Pulmonary effort is normal.  Abdominal:     Palpations: Abdomen is soft.     Tenderness: There is no abdominal tenderness.  Genitourinary:    Rectum: External hemorrhoid present.     Comments: Right side hemorrhoid present, tender, nonthrombosed. Skin:    General: Skin is warm and dry.     Findings: No erythema or rash.  Neurological:     Mental Status: She is alert and oriented to person, place, and time.  Psychiatric:        Behavior: Behavior normal.     ED Results / Procedures / Treatments   Labs (all labs ordered are listed, but only abnormal results are displayed) Labs Reviewed - No data to display  EKG None  Radiology No results found.  Procedures Procedures (including critical care time)  Medications Ordered in ED Medications - No data to display  ED Course  I have reviewed the triage vital signs and the nursing notes.  Pertinent labs &  imaging results that were available during my care of the patient were reviewed by me and considered in my medical decision making (see chart for details).  Clinical Course as of Sep 09 1516  Sun Sep 10, 2019  4736 69 year old female with complaint of rectal pain and pink streaks on the toilet paper after passing a small formed stools few days ago.  No abdominal  pain, no history of hemorrhoids.  Chaperone present, on exam patient has a tender nonthrombosed external hemorrhoid.  Recommend Colace and MiraLAX, given Anusol prescription and advised to use Tucks pads.  Recheck with PCP if symptoms persist.   [LM]    Clinical Course User Index [LM] Alden Hipp   MDM Rules/Calculators/A&P                      Final Clinical Impression(s) / ED Diagnoses Final diagnoses:  Hemorrhoids, unspecified hemorrhoid type    Rx / DC Orders ED Discharge Orders         Ordered    hydrocortisone (ANUSOL-HC) 25 MG suppository  2 times daily     09/10/19 1514           Jeannie Fend, PA-C 09/10/19 1519    Virgina Norfolk, DO 09/13/19 1724

## 2019-09-10 NOTE — Discharge Instructions (Addendum)
Take Colace and MiraLAX to keep stools soft and moving.  Avoid prolonged sitting on the commode. Prescription for Anusol suppository sent to your pharmacy, if this is not covered by your insurance, there is an over-the-counter steroid suppository you can use, ask your pharmacist if you have any questions. Also recommend Tucks pads, these are also available over-the-counter, you can store them in your refrigerator and apply to area for relief. Follow-up with your primary care provider if pain persists.

## 2019-09-14 DIAGNOSIS — K649 Unspecified hemorrhoids: Secondary | ICD-10-CM | POA: Diagnosis not present

## 2019-11-05 NOTE — Progress Notes (Signed)
Cardiology Office Note:   Date:  11/07/2019  NAME:  Heather Hart    MRN: [id number] DOB:  25-Jul-1950   PCP:  Lorenda Ishihara, MD  Cardiologist:  Reatha Harps, MD   Referring MD: Lorenda Ishihara,*   Chief Complaint  Patient presents with   Hyperlipidemia   History of Present Illness:   Ronald Vinsant is a 69 y.o. female with a hx of PAD, DM, HTN, HLD who presents for follow-up. Was seen 07/2019 for chest pain and had normal work-up. We changed her BP meds and re-checked lipid profile.  Her LDL cholesterol still not at goal.  I did discuss Zetia with her.  She is interested.  Regarding her PAD she reports she is quite symptomatic from this.  She reports she is only able to walk 15 to 20 feet before she gets claudication symptoms in her legs and buttocks.  She reports she stops and her symptoms subside and then she continues to go about her day.  She has been evaluated extensively with vascular surgery without intervention.  It appears she may be heading towards a possible intervention based on her symptoms.  She has what I would consider very life limiting symptoms of PAD.  Blood pressure 140/67.  She denies any further episodes of chest pain or shortness of breath.  We did review her echocardiogram as well as stress test in office.  This is reassuring.  Should she need any vascular surgery, she can proceed.  Problem List 1. PAD -chronic distal aortic occlusion/occluded common iliac arteries -managed conservatively  -R ABI 0.56, L ABI 0.56 2. Diabetes -A1c 7.2 3. HTN 4. HLD -Total cholesterol 139, HDL 52, LDL 82, triglycerides 68 5. Chest pain -normal Echo, EF 65% -normal NM MPI 07/2019  Past Medical History: Past Medical History:  Diagnosis Date   Diabetes mellitus    Diet and exercise   Hyperlipidemia    Hypertension    Peripheral arterial disease (HCC)    Peripheral vascular disease (HCC)     Past Surgical History: Past Surgical History:    Procedure Laterality Date   aortogram  05/07/2008, 06/22/2005   CESAREAN SECTION  1976  and 1979   COLONOSCOPY WITH PROPOFOL N/A 09/25/2014   Procedure: COLONOSCOPY WITH PROPOFOL;  Surgeon: Charolett Bumpers, MD;  Location: [facility];  Service: Endoscopy;  Laterality: N/A;   PARTIAL HYSTERECTOMY     stenting of right common iliac artery   05/07/2008    Current Medications: Current Meds  Medication Sig   ALPRAZolam (XANAX) 1 MG tablet Take 1 mg by mouth at bedtime as needed.   aspirin EC 81 MG tablet Take 81 mg by mouth daily.     benazepril (LOTENSIN) 40 MG tablet Take 40 mg by mouth daily.   Calcium Carbonate-Vitamin D (CALCIUM + D) 600-200 MG-UNIT per tablet Take 1 tablet by mouth 2 (two) times daily.     glimepiride (AMARYL) 4 MG tablet    hydrALAZINE (APRESOLINE) 25 MG tablet Take 25 mg by mouth in the morning and at bedtime.   hydrochlorothiazide (HYDRODIURIL) 25 MG tablet Take 25 mg by mouth daily.     hydrocortisone (ANUSOL-HC) 25 MG suppository Place 1 suppository (25 mg total) rectally 2 (two) times daily.   JANUVIA 100 MG tablet Take 100 mg by mouth daily.   JARDIANCE 10 MG TABS tablet TK 1 T PO  WITH FOOD   metFORMIN (GLUCOPHAGE) 500 MG tablet Take 500 mg by mouth 2 (two) times daily  with a meal.   NIFEdipine (PROCARDIA XL/NIFEDICAL-XL) 90 MG 24 hr tablet Take 1 tablet (90 mg total) by mouth daily.   ONETOUCH ULTRA test strip daily. as directed   OZEMPIC, 0.25 OR 0.5 MG/DOSE, 2 MG/1.5ML SOPN [place] AS DIRECTED ONCE WEEKLY   rosuvastatin (CRESTOR) 40 MG tablet Take 40 mg by mouth daily.   [DISCONTINUED] ramipril (ALTACE) 10 MG capsule Take 10 mg by mouth daily.       Allergies:    Patient has no known allergies.   Social History: Social History   Socioeconomic History   Marital status: Widowed    Spouse name: Not on file   Number of children: 2   Years of education: Not on file   Highest education level: Not on file  Occupational History    Not on file  Tobacco Use   Smoking status: Former Smoker    Packs/day: 0.25    Years: 20.00    Pack years: 5.00    Types: Cigarettes    Quit date: 11/26/1996    Years since quitting: 22.9   Smokeless tobacco: Never Used  Vaping Use   Vaping Use: Never used  Substance and Sexual Activity   Alcohol use: Not Currently   Drug use: No   Sexual activity: Not on file  Other Topics Concern   Not on file  Social History Narrative   Not on file   Social Determinants of Health   Financial Resource Strain:    Difficulty of Paying Living Expenses:   Food Insecurity:    Worried About Programme researcher, broadcasting/film/video in the Last Year:    Barista in the Last Year:   Transportation Needs:    Freight forwarder (Medical):    Lack of Transportation (Non-Medical):   Physical Activity:    Days of Exercise per Week:    Minutes of Exercise per Session:   Stress:    Feeling of Stress :   Social Connections:    Frequency of Communication with Friends and Family:    Frequency of Social Gatherings with Friends and Family:    Attends Religious Services:    Active Member of Clubs or Organizations:    Attends Banker Meetings:    Marital Status:      Family History: The patient's family history includes Cancer in her brother, paternal grandmother, and sister; Heart attack in her brother and father; Heart disease in her brother and father; Hypertension in her brother, father, mother, and son; Stroke in her maternal grandmother and mother. There is no history of Breast cancer.  ROS:   All other ROS reviewed and negative. Pertinent positives noted in the HPI.     EKGs/Labs/Other Studies Reviewed:   The following studies were personally reviewed by me today:  NM Stress 08/16/2019  The left ventricular ejection fraction is normal (55-65%).  Nuclear stress EF: 58%.  There was no ST segment deviation noted during stress.  The study is normal.  This is a low  risk study.  Patient was extremely hypertensive prior to testing and throughout the study.   TTE 08/18/2019 1. Left ventricular ejection fraction, by estimation, is 60 to 65%. The  left ventricle has normal function. The left ventricle has no regional  wall motion abnormalities. Left ventricular diastolic parameters are  consistent with Grade I diastolic  dysfunction (impaired relaxation). Elevated left ventricular end-diastolic  pressure.  2. Right ventricular systolic function is normal. The right ventricular  size is normal.  Tricuspid regurgitation signal is inadequate for assessing  PA pressure.  3. The mitral valve is normal in structure. Mild mitral valve  regurgitation. No evidence of mitral stenosis.  4. The aortic valve is tricuspid. Aortic valve regurgitation is mild.  Mild aortic valve sclerosis is present, with no evidence of aortic valve  stenosis.  5. The inferior vena cava is normal in size with greater than 50%  respiratory variability, suggesting right atrial pressure of 3 mmHg.   Recent Labs: No results found for requested labs within last 8760 hours.   Recent Lipid Panel    Component Value Date/Time   CHOL 139 08/31/2019 0814   TRIG 68 08/31/2019 0814   HDL 52 08/31/2019 0814   CHOLHDL 2.7 08/31/2019 0814   LDLCALC 73 08/31/2019 0814   LDLDIRECT 82 08/31/2019 0814    Physical Exam:   VS:  BP 140/67    Pulse 89    Ht 5\' 1"  (1.549 m)    Wt 166 lb 3.2 oz (75.4 kg)    SpO2 99%    BMI 31.40 kg/m    Wt Readings from Last 3 Encounters:  11/07/19 166 lb 3.2 oz (75.4 kg)  09/10/19 170 lb (77.1 kg)  08/30/19 170 lb 3.2 oz (77.2 kg)    General: Well nourished, well developed, in no acute distress Heart: Atraumatic, normal size  Eyes: PEERLA, EOMI  Neck: Supple, no JVD Endocrine: No thryomegaly Cardiac: Normal S1, S2; RRR; no murmurs, rubs, or gallops Lungs: Clear to auscultation bilaterally, no wheezing, rhonchi or rales  Abd: Soft, nontender, no  hepatomegaly  Ext: Absent lower extremity pulses Musculoskeletal: No deformities, BUE and BLE strength normal and equal Skin: Warm and dry, no rashes   Neuro: Alert and oriented to person, place, time, and situation, CNII-XII grossly intact, no focal deficits  Psych: Normal mood and affect   ASSESSMENT:   Beatris Belen is a 69 y.o. female who presents for the following: 1. Mixed hyperlipidemia   2. Essential hypertension   3. Peripheral vascular disease, unspecified ([place])     PLAN:   1. Mixed hyperlipidemia -Most recent LDL 82.  On Crestor 40 mg daily.  Given PAD LDL cholesterol goal is less than 70.  We will plan to add Zetia 10 mg daily in addition to her Crestor 40 mg daily.  I think this will get her to a goal less than 70.  She should see [place] back in 6 months and we will recheck a lipid profile 1 week before that appointment.  2. Essential hypertension -BP 140/67.  We will continue current medications of benazepril, hydralazine, HCTZ, nifedipine.  We added nifedipine 90 mg extended release at her last visit and her blood pressure is better today.  She will continue to work on salt reduction strategy as well.  3. Peripheral vascular disease, unspecified ([place]) -Extensive lower extremity PAD.  Her distal aorta and common iliacs bilaterally are occluded.  She is been fairly symptom-free per vascular surgery notes and per her report.  She does report she is having worsening claudication symptoms.  She will be reevaluated by Dr. Scot Dock next month.  She may need intervention.  She had a normal echocardiogram and normal cardiac stress test.  She can proceed with any procedure per their discretion.  Disposition: Return in about 6 months (around 05/08/2020).  Medication Adjustments/Labs and Tests Ordered: Current medicines are reviewed at length with the patient today.  Concerns regarding medicines are outlined above.  Orders Placed This Encounter  Procedures   Lipid panel   Meds  ordered this encounter  Medications   ezetimibe (ZETIA) 10 MG tablet    Sig: Take 1 tablet (10 mg total) by mouth daily.    Dispense:  90 tablet    Refill:  3    Patient Instructions  Medication Instructions:  Start ZETIA 10 mg daily   *If you need a refill on your cardiac medications before your next appointment, please call your pharmacy*   Lab Work: LIPID 1 week before follow up in 6 months.   If you have labs (blood work) drawn today and your tests are completely normal, you will receive your results only by:  MyChart Message (if you have MyChart) OR  A paper copy in the mail If you have any lab test that is abnormal or we need to change your treatment, we will call you to review the results.    Follow-Up: At [facility], you and your health needs are our priority.  As part of our continuing mission to provide you with exceptional heart care, we have created designated Provider Care Teams.  These Care Teams include your primary Cardiologist (physician) and Advanced Practice Providers (APPs -  Physician Assistants and Nurse Practitioners) who all work together to provide you with the care you need, when you need it.  We recommend signing up for the patient portal called "MyChart".  Sign up information is provided on this After Visit Summary.  MyChart is used to connect with patients for Virtual Visits (Telemedicine).  Patients are able to view lab/test results, encounter notes, upcoming appointments, etc.  Non-urgent messages can be sent to your provider as well.   To learn more about what you can do with MyChart, go to [email or web address].    Your next appointment:   6 month(s)  The format for your next appointment:   In Person  Provider:   Lennie Odor, MD       Time Spent with Patient: I have spent a total of 35 minutes with patient reviewing hospital notes, telemetry, EKGs, labs and examining the patient as well as establishing an assessment and plan that  was discussed with the patient.  > 50% of time was spent in direct patient care.  Signed, Lenna Gilford. Flora Lipps, MD [facility]  [address], [address] [place], [place] [address] [phone]  11/07/2019 8:50 AM

## 2019-11-06 ENCOUNTER — Telehealth: Payer: Self-pay | Admitting: Cardiovascular Disease

## 2019-11-06 NOTE — Telephone Encounter (Signed)
Left message for patient if she would like to come anyway it will be okay. She can decide what she would prefer to do.

## 2019-11-06 NOTE — Telephone Encounter (Signed)
Patient has a f/u appt with Dr. Flora Lipps tomorrow at 8:20am. She had her BP readings recorded on her BP machine that she was to bring to the visit tomorrow. But, she states her history on her BP machine was just erased and she is not sure if she should reschedule so she is able to get more BP readings. Please advise.

## 2019-11-07 ENCOUNTER — Other Ambulatory Visit: Payer: Self-pay

## 2019-11-07 ENCOUNTER — Encounter: Payer: Self-pay | Admitting: Cardiovascular Disease

## 2019-11-07 ENCOUNTER — Ambulatory Visit (INDEPENDENT_AMBULATORY_CARE_PROVIDER_SITE_OTHER): Payer: BC Managed Care – PPO | Admitting: Cardiovascular Disease

## 2019-11-07 VITALS — BP 140/67 | HR 89 | Ht 61.0 in | Wt 166.2 lb

## 2019-11-07 DIAGNOSIS — I739 Peripheral vascular disease, unspecified: Secondary | ICD-10-CM | POA: Diagnosis not present

## 2019-11-07 DIAGNOSIS — E782 Mixed hyperlipidemia: Secondary | ICD-10-CM | POA: Diagnosis not present

## 2019-11-07 DIAGNOSIS — I10 Essential (primary) hypertension: Secondary | ICD-10-CM | POA: Diagnosis not present

## 2019-11-07 MED ORDER — EZETIMIBE 10 MG PO TABS: 10.0000 mg | ORAL_TABLET | Freq: Every day | ORAL | 3 refills | Status: DC

## 2019-11-07 NOTE — Patient Instructions (Signed)
Medication Instructions:  Start ZETIA 10 mg daily   *If you need a refill on your cardiac medications before your next appointment, please call your pharmacy*   Lab Work: LIPID 1 week before follow up in 6 months.   If you have labs (blood work) drawn today and your tests are completely normal, you will receive your results only by: Marland Kitchen MyChart Message (if you have MyChart) OR . A paper copy in the mail If you have any lab test that is abnormal or we need to change your treatment, we will call you to review the results.    Follow-Up: At [facility], you and your health needs are our priority.  As part of our continuing mission to provide you with exceptional heart care, we have created designated Provider Care Teams.  These Care Teams include your primary Cardiologist (physician) and Advanced Practice Providers (APPs -  Physician Assistants and Nurse Practitioners) who all work together to provide you with the care you need, when you need it.  We recommend signing up for the patient portal called "MyChart".  Sign up information is provided on this After Visit Summary.  MyChart is used to connect with patients for Virtual Visits (Telemedicine).  Patients are able to view lab/test results, encounter notes, upcoming appointments, etc.  Non-urgent messages can be sent to your provider as well.   To learn more about what you can do with MyChart, go to [email or web address].    Your next appointment:   6 month(s)  The format for your next appointment:   In Person  Provider:   Lennie Odor, MD

## 2019-11-10 ENCOUNTER — Telehealth: Payer: Self-pay

## 2019-11-10 MED ORDER — EZETIMIBE 10 MG PO TABS: 10.0000 mg | ORAL_TABLET | Freq: Every day | ORAL | 3 refills | Status: DC

## 2019-11-10 NOTE — Telephone Encounter (Signed)
Received notification that patient needed to have PA completed for ZETIA- Completed on Covermymeds.   PA Case: [id number], Status: Approved, Coverage Starts on: 11/10/2019 12:00:00 AM, Coverage Ends on: 11/09/2020 12:00:00 AM   Will resend to pharmacy.

## 2019-11-21 ENCOUNTER — Other Ambulatory Visit: Payer: Self-pay | Admitting: Internal Medicine

## 2019-11-21 DIAGNOSIS — Z1389 Encounter for screening for other disorder: Secondary | ICD-10-CM | POA: Diagnosis not present

## 2019-11-21 DIAGNOSIS — M8588 Other specified disorders of bone density and structure, other site: Secondary | ICD-10-CM | POA: Diagnosis not present

## 2019-11-21 DIAGNOSIS — Z1231 Encounter for screening mammogram for malignant neoplasm of breast: Secondary | ICD-10-CM

## 2019-11-21 DIAGNOSIS — I10 Essential (primary) hypertension: Secondary | ICD-10-CM | POA: Diagnosis not present

## 2019-11-21 DIAGNOSIS — E785 Hyperlipidemia, unspecified: Secondary | ICD-10-CM | POA: Diagnosis not present

## 2019-11-21 DIAGNOSIS — Z0000 Encounter for general adult medical examination without abnormal findings: Secondary | ICD-10-CM | POA: Diagnosis not present

## 2019-11-21 DIAGNOSIS — E1151 Type 2 diabetes mellitus with diabetic peripheral angiopathy without gangrene: Secondary | ICD-10-CM | POA: Diagnosis not present

## 2019-12-21 DIAGNOSIS — I10 Essential (primary) hypertension: Secondary | ICD-10-CM | POA: Diagnosis not present

## 2019-12-21 DIAGNOSIS — E785 Hyperlipidemia, unspecified: Secondary | ICD-10-CM | POA: Diagnosis not present

## 2019-12-28 ENCOUNTER — Ambulatory Visit
Admission: RE | Admit: 2019-12-28 | Discharge: 2019-12-28 | Disposition: A | Discharge status: Home / Self Care | Payer: BC Managed Care – PPO | Source: Ambulatory Visit | Attending: Internal Medicine | Admitting: Internal Medicine

## 2019-12-28 ENCOUNTER — Other Ambulatory Visit: Payer: Self-pay

## 2019-12-28 DIAGNOSIS — M8589 Other specified disorders of bone density and structure, multiple sites: Secondary | ICD-10-CM | POA: Diagnosis not present

## 2019-12-28 DIAGNOSIS — Z780 Asymptomatic menopausal state: Secondary | ICD-10-CM | POA: Diagnosis not present

## 2019-12-28 DIAGNOSIS — M8588 Other specified disorders of bone density and structure, other site: Secondary | ICD-10-CM

## 2019-12-28 DIAGNOSIS — Z1231 Encounter for screening mammogram for malignant neoplasm of breast: Secondary | ICD-10-CM | POA: Diagnosis not present

## 2020-03-01 ENCOUNTER — Ambulatory Visit: Payer: BC Managed Care – PPO | Attending: Internal Medicine

## 2020-03-01 DIAGNOSIS — Z23 Encounter for immunization: Secondary | ICD-10-CM

## 2020-03-01 NOTE — Progress Notes (Signed)
   Covid-19 Vaccination Clinic  Name:  Heather Hart    MRN: [id number] DOB: [birth year]  03/01/2020  [name] was observed post Covid-19 immunization for 15 minutes without incident. She was provided with Vaccine Information Sheet and instruction to access the V-Safe system.   [name] was instructed to call 911 with any severe reactions post vaccine: Marland Kitchen Difficulty breathing  . Swelling of face and throat  . A fast heartbeat  . A bad rash all over body  . Dizziness and weakness

## 2020-05-09 NOTE — Progress Notes (Signed)
Cardiology Office Note:   Date:  05/13/2020  NAME:  Heather SermonSheila Teel Hart    MRN: [id number] DOB:  05/31/1950   PCP:  Lorenda IshiharaVaradarajan, Rupashree, MD  Cardiologist:  Reatha HarpsWesley T O'Neal, MD   Referring MD: Lorenda IshiharaVaradarajan, Rupashree,*   Chief Complaint  Patient presents with  . PAD        History of Present Illness:   Heather Hart is a 69 y.o. female with a hx of PAD, HTN, HLD, DM who presents for follow-up.  Most recent LDL cholesterol 32.  Most recent diabetes 7.4.  She was put on Ozempic.  This is a good agent which has cardiovascular prevention.  She reports she is walking up to 10 minutes/day.  No chest pain or shortness of breath.  She does get some claudication symptoms but is able to stop and symptoms improved.  Her blood pressure is 100/64.  Well controlled.  No symptoms of dizziness or lightheadedness.  Given her significant PAD I think it is okay to continue her current agents.  She is tolerating Crestor and Zetia well.  No symptoms of chest pain today.  All of her numbers appear to be at goal.  She has lost roughly 10 pounds since retiring from work.  Problem List 1. PAD -chronic distal aortic occlusion/occluded common iliac arteries -managed conservatively  -R ABI 0.56, L ABI 0.56 2. Diabetes -A1c 7.4 3. HTN 4. HLD -T chol 87, HDL 36, LDL 32, triglycerides 95 5. Chest pain -normal Echo, EF 65% -normal NM MPI 07/2019  Past Medical History: Past Medical History:  Diagnosis Date  . Diabetes mellitus    Diet and exercise  . Hyperlipidemia   . Hypertension   . Peripheral arterial disease (HCC)   . Peripheral vascular disease [facility](HCC)     Past Surgical History: Past Surgical History:  Procedure Laterality Date  . aortogram  05/07/2008, 06/22/2005  . CESAREAN SECTION  1976  and 1979  . COLONOSCOPY WITH PROPOFOL N/A 09/25/2014   Procedure: COLONOSCOPY WITH PROPOFOL;  Surgeon: Charolett BumpersMartin K Johnson, MD;  Location: [facility] ENDOSCOPY;  Service: Endoscopy;  Laterality: N/A;  . PARTIAL  HYSTERECTOMY    . stenting of right common iliac artery   05/07/2008    Current Medications: Current Meds  Medication Sig  . ALPRAZolam (XANAX) 1 MG tablet Take 1 mg by mouth at bedtime as needed.  Marland Kitchen. aspirin EC 81 MG tablet Take 81 mg by mouth daily.  . benazepril (LOTENSIN) 40 MG tablet Take 40 mg by mouth daily.  . Calcium Carbonate-Vitamin D (CALCIUM + D) 600-200 MG-UNIT per tablet Take 1 tablet by mouth 2 (two) times daily.  Marland Kitchen. glimepiride (AMARYL) 4 MG tablet   . hydrALAZINE (APRESOLINE) 25 MG tablet Take 25 mg by mouth in the morning and at bedtime.  . hydrochlorothiazide (HYDRODIURIL) 25 MG tablet Take 25 mg by mouth daily.  . metFORMIN (GLUCOPHAGE-XR) 500 MG 24 hr tablet Take 500 mg by mouth daily. Pt takes 4 tablets in the evening  . NIFEdipine (PROCARDIA XL/NIFEDICAL-XL) 90 MG 24 hr tablet Take 1 tablet (90 mg total) by mouth daily.  Letta Pate. ONETOUCH ULTRA test strip daily. as directed  . OZEMPIC, 0.25 OR 0.5 MG/DOSE, 2 MG/1.5ML SOPN [place] AS DIRECTED ONCE WEEKLY  . rosuvastatin (CRESTOR) 40 MG tablet Take 40 mg by mouth daily.  . [DISCONTINUED] ezetimibe (ZETIA) 10 MG tablet Take 1 tablet (10 mg total) by mouth daily.  . [DISCONTINUED] metFORMIN (GLUCOPHAGE) 500 MG tablet Take 500 mg by mouth 2 (  two) times daily with a meal.     Allergies:    Patient has no known allergies.   Social History: Social History   Socioeconomic History  . Marital status: Widowed    Spouse name: Not on file  . Number of children: 2  . Years of education: Not on file  . Highest education level: Not on file  Occupational History  . Not on file  Tobacco Use  . Smoking status: Former Smoker    Packs/day: 0.25    Years: 20.00    Pack years: 5.00    Types: Cigarettes    Quit date: 11/26/1996    Years since quitting: 23.4  . Smokeless tobacco: Never Used  Vaping Use  . Vaping Use: Never used  Substance and Sexual Activity  . Alcohol use: Not Currently  . Drug use: No  . Sexual activity: Not on  file  Other Topics Concern  . Not on file  Social History Narrative  . Not on file   Social Determinants of Health   Financial Resource Strain: Not on file  Food Insecurity: Not on file  Transportation Needs: Not on file  Physical Activity: Not on file  Stress: Not on file  Social Connections: Not on file     Family History: The patient's family history includes Cancer in her brother, paternal grandmother, and sister; Heart attack in her brother and father; Heart disease in her brother and father; Hypertension in her brother, father, mother, and son; Stroke in her maternal grandmother and mother. There is no history of Breast cancer.  ROS:   All other ROS reviewed and negative. Pertinent positives noted in the HPI.     EKGs/Labs/Other Studies Reviewed:   The following studies were personally reviewed by me today:  TTE 08/18/2019 1. Left ventricular ejection fraction, by estimation, is 60 to 65%. The  left ventricle has normal function. The left ventricle has no regional  wall motion abnormalities. Left ventricular diastolic parameters are  consistent with Grade I diastolic  dysfunction (impaired relaxation). Elevated left ventricular end-diastolic  pressure.  2. Right ventricular systolic function is normal. The right ventricular  size is normal. Tricuspid regurgitation signal is inadequate for assessing  PA pressure.  3. The mitral valve is normal in structure. Mild mitral valve  regurgitation. No evidence of mitral stenosis.  4. The aortic valve is tricuspid. Aortic valve regurgitation is mild.  Mild aortic valve sclerosis is present, with no evidence of aortic valve  stenosis.  5. The inferior vena cava is normal in size with greater than 50%  respiratory variability, suggesting right atrial pressure of 3 mmHg.   NM Stress 08/16/2019   The left ventricular ejection fraction is normal (55-65%).  Nuclear stress EF: 58%.  There was no ST segment deviation noted  during stress.  The study is normal.  This is a low risk study.  Patient was extremely hypertensive prior to testing and throughout the study.    Recent Labs: No results found for requested labs within last 8760 hours.   Recent Lipid Panel    Component Value Date/Time   CHOL 139 08/31/2019 0814   TRIG 68 08/31/2019 0814   HDL 52 08/31/2019 0814   CHOLHDL 2.7 08/31/2019 0814   LDLCALC 73 08/31/2019 0814   LDLDIRECT 82 08/31/2019 0814    Physical Exam:   VS:  BP 100/64 (BP Location: Left Arm, Patient Position: Sitting)   Pulse 88   Ht 5\' 1"  (1.549 m)   Wt  158 lb 9.6 oz (71.9 kg)   SpO2 98%   BMI 29.97 kg/m    Wt Readings from Last 3 Encounters:  05/13/20 158 lb 9.6 oz (71.9 kg)  11/07/19 166 lb 3.2 oz (75.4 kg)  09/10/19 170 lb (77.1 kg)    General: Well nourished, well developed, in no acute distress Head: Atraumatic, normal size  Eyes: PEERLA, EOMI  Neck: Supple, no JVD Endocrine: No thryomegaly Cardiac: Normal S1, S2; RRR; no murmurs, rubs, or gallops Lungs: Clear to auscultation bilaterally, no wheezing, rhonchi or rales  Abd: Soft, nontender, no hepatomegaly  Ext: Absent lower extremity pulses Musculoskeletal: No deformities, BUE and BLE strength normal and equal Skin: Warm and dry, no rashes   Neuro: Alert and oriented to person, place, time, and situation, CNII-XII grossly intact, no focal deficits  Psych: Normal mood and affect   ASSESSMENT:   Chynah Orihuela is a 69 y.o. female who presents for the following: 1. Peripheral vascular disease, unspecified (HCC)   2. Mixed hyperlipidemia   3. Essential hypertension     PLAN:   1. Peripheral vascular disease, unspecified (HCC) 2. Mixed hyperlipidemia -chronic distal aortic occlusion/occluded common iliac arteries -managed conservatively  -R ABI 0.56, L ABI 0.56 -No great options for her.  She is been managed medically.  Doing well and can walk up to 10 minutes without any significant  claudication.  She will continue to follow with vascular surgery.  For now she will continue with medical management. -Blood pressure well at goal.  I would keep her blood pressure less than 120/80. -Most recent LDL cholesterol 32.  She will continue Crestor and Zetia.  We will continue aggressive lipid-lowering therapy with LDL less than 50. -Continue with exercising. -She is on diabetes medicines that have cardiovascular prevention properties.  She will continue these.  3. Essential hypertension -BP controlled.  Continue medications.  Disposition: Return in about 1 year (around 05/13/2021).  Medication Adjustments/Labs and Tests Ordered: Current medicines are reviewed at length with the patient today.  Concerns regarding medicines are outlined above.  No orders of the defined types were placed in this encounter.  No orders of the defined types were placed in this encounter.   Patient Instructions  Medication Instructions:  The current medical regimen is effective;  continue present plan and medications.  *If you need a refill on your cardiac medications before your next appointment, please call your pharmacy*   Follow-Up: At [facility], you and your health needs are our priority.  As part of our continuing mission to provide you with exceptional heart care, we have created designated Provider Care Teams.  These Care Teams include your primary Cardiologist (physician) and Advanced Practice Providers (APPs -  Physician Assistants and Nurse Practitioners) who all work together to provide you with the care you need, when you need it.  We recommend signing up for the patient portal called "MyChart".  Sign up information is provided on this After Visit Summary.  MyChart is used to connect with patients for Virtual Visits (Telemedicine).  Patients are able to view lab/test results, encounter notes, upcoming appointments, etc.  Non-urgent messages can be sent to your provider as well.   To  learn more about what you can do with MyChart, go to [email or web address].    Your next appointment:   12 month(s)  The format for your next appointment:   In Person  Provider:   Lennie Odor, MD        Time Spent with  Patient: I have spent a total of 25 minutes with patient reviewing hospital notes, telemetry, EKGs, labs and examining the patient as well as establishing an assessment and plan that was discussed with the patient.  > 50% of time was spent in direct patient care.  Signed, Lenna Gilford. Flora Lipps, MD [facility]  [address] [place], [place] [address] [phone]  05/13/2020 11:40 AM

## 2020-05-13 ENCOUNTER — Encounter: Payer: Self-pay | Admitting: Cardiovascular Disease

## 2020-05-13 ENCOUNTER — Ambulatory Visit (INDEPENDENT_AMBULATORY_CARE_PROVIDER_SITE_OTHER): Payer: Medicare Other | Admitting: Cardiovascular Disease

## 2020-05-13 ENCOUNTER — Other Ambulatory Visit: Payer: Self-pay

## 2020-05-13 VITALS — BP 100/64 | HR 88 | Ht 61.0 in | Wt 158.6 lb

## 2020-05-13 DIAGNOSIS — I739 Peripheral vascular disease, unspecified: Secondary | ICD-10-CM

## 2020-05-13 DIAGNOSIS — E782 Mixed hyperlipidemia: Secondary | ICD-10-CM

## 2020-05-13 DIAGNOSIS — I10 Essential (primary) hypertension: Secondary | ICD-10-CM

## 2020-05-13 NOTE — Patient Instructions (Signed)

## 2021-01-30 ENCOUNTER — Other Ambulatory Visit: Payer: Self-pay | Admitting: Internal Medicine

## 2021-01-30 DIAGNOSIS — Z1231 Encounter for screening mammogram for malignant neoplasm of breast: Secondary | ICD-10-CM

## 2021-03-04 ENCOUNTER — Other Ambulatory Visit: Payer: Self-pay

## 2021-03-04 ENCOUNTER — Ambulatory Visit
Admission: RE | Admit: 2021-03-04 | Discharge: 2021-03-04 | Disposition: A | Discharge status: Home / Self Care | Payer: Medicare Other | Source: Ambulatory Visit | Attending: Internal Medicine | Admitting: Internal Medicine

## 2021-03-04 DIAGNOSIS — Z1231 Encounter for screening mammogram for malignant neoplasm of breast: Secondary | ICD-10-CM

## 2021-03-10 ENCOUNTER — Other Ambulatory Visit: Payer: Self-pay | Admitting: Internal Medicine

## 2021-03-10 DIAGNOSIS — R928 Other abnormal and inconclusive findings on diagnostic imaging of breast: Secondary | ICD-10-CM

## 2021-03-31 ENCOUNTER — Other Ambulatory Visit: Payer: BC Managed Care – PPO

## 2021-05-06 ENCOUNTER — Ambulatory Visit
Admission: RE | Admit: 2021-05-06 | Discharge: 2021-05-06 | Disposition: A | Discharge status: Home / Self Care | Payer: Medicare Other | Source: Ambulatory Visit | Attending: Internal Medicine | Admitting: Internal Medicine

## 2021-05-06 ENCOUNTER — Other Ambulatory Visit: Payer: Self-pay | Admitting: Internal Medicine

## 2021-05-06 DIAGNOSIS — R928 Other abnormal and inconclusive findings on diagnostic imaging of breast: Secondary | ICD-10-CM

## 2021-05-06 DIAGNOSIS — N6001 Solitary cyst of right breast: Secondary | ICD-10-CM

## 2021-09-10 DIAGNOSIS — Z1152 Encounter for screening for COVID-19: Secondary | ICD-10-CM | POA: Diagnosis not present

## 2021-09-24 DIAGNOSIS — Z1152 Encounter for screening for COVID-19: Secondary | ICD-10-CM | POA: Diagnosis not present

## 2021-11-07 ENCOUNTER — Other Ambulatory Visit: Payer: BC Managed Care – PPO

## 2021-11-17 ENCOUNTER — Ambulatory Visit
Admission: RE | Admit: 2021-11-17 | Discharge: 2021-11-17 | Disposition: A | Discharge status: Home / Self Care | Payer: Medicare PPO | Source: Ambulatory Visit | Attending: Internal Medicine | Admitting: Internal Medicine

## 2021-11-17 DIAGNOSIS — N6001 Solitary cyst of right breast: Secondary | ICD-10-CM

## 2021-11-17 DIAGNOSIS — N6312 Unspecified lump in the right breast, upper inner quadrant: Secondary | ICD-10-CM | POA: Diagnosis not present

## 2021-12-10 ENCOUNTER — Other Ambulatory Visit: Payer: Self-pay | Admitting: Internal Medicine

## 2021-12-10 DIAGNOSIS — Z1231 Encounter for screening mammogram for malignant neoplasm of breast: Secondary | ICD-10-CM

## 2021-12-10 DIAGNOSIS — Z1159 Encounter for screening for other viral diseases: Secondary | ICD-10-CM | POA: Diagnosis not present

## 2021-12-10 DIAGNOSIS — E785 Hyperlipidemia, unspecified: Secondary | ICD-10-CM | POA: Diagnosis not present

## 2021-12-10 DIAGNOSIS — I10 Essential (primary) hypertension: Secondary | ICD-10-CM | POA: Diagnosis not present

## 2021-12-10 DIAGNOSIS — Z7189 Other specified counseling: Secondary | ICD-10-CM | POA: Diagnosis not present

## 2021-12-10 DIAGNOSIS — Z0000 Encounter for general adult medical examination without abnormal findings: Secondary | ICD-10-CM | POA: Diagnosis not present

## 2021-12-10 DIAGNOSIS — E1151 Type 2 diabetes mellitus with diabetic peripheral angiopathy without gangrene: Secondary | ICD-10-CM | POA: Diagnosis not present

## 2021-12-10 DIAGNOSIS — F3341 Major depressive disorder, recurrent, in partial remission: Secondary | ICD-10-CM | POA: Diagnosis not present

## 2022-02-26 ENCOUNTER — Ambulatory Visit
Admission: RE | Admit: 2022-02-26 | Discharge: 2022-02-26 | Disposition: A | Discharge status: Home / Self Care | Payer: Medicare PPO | Source: Ambulatory Visit | Attending: Internal Medicine | Admitting: Internal Medicine

## 2022-02-26 DIAGNOSIS — Z1231 Encounter for screening mammogram for malignant neoplasm of breast: Secondary | ICD-10-CM

## 2022-03-02 DIAGNOSIS — K047 Periapical abscess without sinus: Secondary | ICD-10-CM | POA: Diagnosis not present

## 2022-03-02 DIAGNOSIS — J029 Acute pharyngitis, unspecified: Secondary | ICD-10-CM | POA: Diagnosis not present

## 2022-03-24 ENCOUNTER — Ambulatory Visit
Admission: RE | Admit: 2022-03-24 | Discharge: 2022-03-24 | Disposition: A | Discharge status: Home / Self Care | Payer: Medicare PPO | Source: Ambulatory Visit | Attending: Internal Medicine | Admitting: Internal Medicine

## 2022-03-24 DIAGNOSIS — Z1231 Encounter for screening mammogram for malignant neoplasm of breast: Secondary | ICD-10-CM | POA: Diagnosis not present

## 2022-04-23 ENCOUNTER — Emergency Department (HOSPITAL_BASED_OUTPATIENT_CLINIC_OR_DEPARTMENT_OTHER): Payer: Medicare PPO | Admitting: Radiology

## 2022-04-23 ENCOUNTER — Other Ambulatory Visit: Payer: Self-pay

## 2022-04-23 ENCOUNTER — Emergency Department (HOSPITAL_BASED_OUTPATIENT_CLINIC_OR_DEPARTMENT_OTHER)
Admission: EM | Admit: 2022-04-23 | Discharge: 2022-04-23 | Disposition: A | Discharge status: Home / Self Care | Payer: Medicare PPO | Attending: Emergency Medicine | Admitting: Emergency Medicine

## 2022-04-23 DIAGNOSIS — E119 Type 2 diabetes mellitus without complications: Secondary | ICD-10-CM | POA: Diagnosis not present

## 2022-04-23 DIAGNOSIS — S8292XA Unspecified fracture of left lower leg, initial encounter for closed fracture: Secondary | ICD-10-CM | POA: Diagnosis not present

## 2022-04-23 DIAGNOSIS — X501XXA Overexertion from prolonged static or awkward postures, initial encounter: Secondary | ICD-10-CM | POA: Diagnosis not present

## 2022-04-23 DIAGNOSIS — I10 Essential (primary) hypertension: Secondary | ICD-10-CM | POA: Insufficient documentation

## 2022-04-23 DIAGNOSIS — Z7982 Long term (current) use of aspirin: Secondary | ICD-10-CM | POA: Diagnosis not present

## 2022-04-23 DIAGNOSIS — S8262XA Displaced fracture of lateral malleolus of left fibula, initial encounter for closed fracture: Secondary | ICD-10-CM | POA: Diagnosis not present

## 2022-04-23 DIAGNOSIS — S82892A Other fracture of left lower leg, initial encounter for closed fracture: Secondary | ICD-10-CM

## 2022-04-23 DIAGNOSIS — Z7984 Long term (current) use of oral hypoglycemic drugs: Secondary | ICD-10-CM | POA: Insufficient documentation

## 2022-04-23 DIAGNOSIS — M25572 Pain in left ankle and joints of left foot: Secondary | ICD-10-CM | POA: Diagnosis present

## 2022-04-23 MED ORDER — NAPROXEN 500 MG PO TABS: 500.0000 mg | ORAL_TABLET | Freq: Two times a day (BID) | ORAL | 0 refills | Status: AC

## 2022-04-23 MED ORDER — HYDROCODONE-ACETAMINOPHEN 5-325 MG PO TABS: 1.0000 | ORAL_TABLET | ORAL | 0 refills | Status: AC | PRN

## 2022-04-23 NOTE — ED Provider Notes (Signed)
[facility] EMERGENCY DEPT Provider Note   CSN: [id number] Arrival date & time: 04/23/22  4098     History  Chief Complaint  Patient presents with   Ankle Pain    Heather Hart is a 71 y.o. female.  Pt is a 71 yo female hx of PAD, HTN, HLD, and DM.  Pt said she missed a step coming down the stairs last night and twisted her left ankle.  Pt unable to put weight on it.  She was able to drive here, but has an automatic.  She took something for pain this am, but does not know what it was.  It was left over from a tooth extraction.  She denies any other injuries.       Home Medications Prior to Admission medications   Medication Sig Start Date End Date Taking? Authorizing Provider  HYDROcodone-acetaminophen (NORCO/VICODIN) 5-325 MG tablet Take 1 tablet by mouth every 4 (four) hours as needed. 04/23/22  Yes Jacalyn Lefevre, MD  naproxen (NAPROSYN) 500 MG tablet Take 1 tablet (500 mg total) by mouth 2 (two) times daily. 04/23/22  Yes Jacalyn Lefevre, MD  ALPRAZolam Prudy Feeler) 1 MG tablet Take 1 mg by mouth at bedtime as needed.    [provider]  aspirin EC 81 MG tablet Take 81 mg by mouth daily.    [provider]  benazepril (LOTENSIN) 40 MG tablet Take 40 mg by mouth daily. 03/21/19   [provider]  Calcium Carbonate-Vitamin D (CALCIUM + D) 600-200 MG-UNIT per tablet Take 1 tablet by mouth 2 (two) times daily.    [provider]  glimepiride (AMARYL) 4 MG tablet  04/29/16   [provider]  hydrALAZINE (APRESOLINE) 25 MG tablet Take 25 mg by mouth in the morning and at bedtime.    [provider]  hydrochlorothiazide (HYDRODIURIL) 25 MG tablet Take 25 mg by mouth daily.    [provider]  metFORMIN (GLUCOPHAGE-XR) 500 MG 24 hr tablet Take 500 mg by mouth daily. Pt takes 4 tablets in the evening 11/21/19   [provider]  NIFEdipine (PROCARDIA XL/NIFEDICAL-XL) 90 MG 24 hr tablet Take 1 tablet  (90 mg total) by mouth daily. 09/01/19   O'Neal, Ronnald Ramp, MD  [facility] ULTRA test strip daily. as directed 03/27/19   [provider]  OZEMPIC, 0.25 OR 0.5 MG/DOSE, 2 MG/1.5ML SOPN [place] AS DIRECTED ONCE WEEKLY 03/22/19   [provider]  rosuvastatin (CRESTOR) 40 MG tablet Take 40 mg by mouth daily. 02/15/19   [provider]      Allergies    Nitrofurantoin    Review of Systems   Review of Systems  Musculoskeletal:        Left ankle pain  All other systems reviewed and are negative.   Physical Exam Updated Vital Signs BP 133/69   Pulse 72   Temp 99.5 F (37.5 C) (Oral)   Resp 16   Ht 5\' 1"  (1.549 m)   Wt 65.8 kg   SpO2 100%   BMI 27.40 kg/m  Physical Exam Vitals and nursing note reviewed.  Constitutional:      Appearance: Normal appearance.  HENT:     Head: Normocephalic and atraumatic.     Right Ear: External ear normal.     Left Ear: External ear normal.     Nose: Nose normal.     Mouth/Throat:     Mouth: Mucous membranes are moist.     Pharynx: Oropharynx is clear.  Eyes:     Extraocular Movements: Extraocular movements intact.     Conjunctiva/sclera: Conjunctivae normal.     Pupils: Pupils are equal, round, and reactive to light.  Cardiovascular:     Rate and Rhythm: Normal rate and regular rhythm.     Pulses: Normal pulses.     Heart sounds: Normal heart sounds.  Pulmonary:     Effort: Pulmonary effort is normal.     Breath sounds: Normal breath sounds.  Abdominal:     General: Abdomen is flat. Bowel sounds are normal.     Palpations: Abdomen is soft.  Musculoskeletal:     Cervical back: Normal range of motion and neck supple.       Legs:  Skin:    General: Skin is warm.     Capillary Refill: Capillary refill takes less than 2 seconds.  Neurological:     General: No focal deficit present.     Mental Status: She is alert and oriented to person, place, and time.  Psychiatric:        Mood and Affect: Mood normal.         Behavior: Behavior normal.     ED Results / Procedures / Treatments   Labs (all labs ordered are listed, but only abnormal results are displayed) Labs Reviewed - No data to display  EKG None  Radiology DG Ankle Complete Left  Result Date: 04/23/2022 CLINICAL DATA:  Ankle injury EXAM: LEFT ANKLE COMPLETE - 3+ VIEW COMPARISON:  None Available. FINDINGS: There is a mildly displaced fracture of the lateral malleolus. There is widening of the medial clear space. There is soft tissue swelling surrounding the ankle joint. Calcific density adjacent to the medial malleolus is favored to be degenerative. IMPRESSION: Mildly displaced fracture of the lateral malleolus. There is also widening of the medial clear space, which is worrisome for ligamentous injury. Electronically Signed   By: Lorenza Cambridge M.D.   On: 04/23/2022 08:40    Procedures Procedures    Medications Ordered in ED Medications - No data to display  ED Course/ Medical Decision Making/ A&P                           Medical Decision Making Amount and/or Complexity of Data Reviewed Radiology: ordered.  Risk Prescription drug management.   This patient presents to the ED for concern of ankle pain, this involves an extensive number of treatment options, and is a complaint that carries with it a high risk of complications and morbidity.  The differential diagnosis includes fx, sprain   Co morbidities that complicate the patient evaluation  PAD, HTN, HLD, DM    Additional history obtained:  Additional history obtained from epic chart review  X-ray reviewed by me.  I agree with the radiologist. L ankle: Mildly displaced fracture of the lateral malleolus. There is also  widening of the medial clear space, which is worrisome for  ligamentous injury.    Medicines ordered and prescription drug management:  I have reviewed the patients home medicines and have made adjustments as needed   Problem List / ED  Course:  Ankle fracture:  pt placed in a cam walker and is d/c with Naproxyn and Lortab.  She is to return if worse and is to f/u with ortho.   Reevaluation:  After the interventions noted above, I reevaluated the patient and found that they have :improved   Social Determinants of Health:  Lives at home  Dispostion:  After consideration of the diagnostic results and the patients response to treatment, I feel that the patent would benefit from discharge with outpatient f/u.          Final Clinical Impression(s) / ED Diagnoses Final diagnoses:  Closed fracture of left ankle, initial encounter    Rx / DC Orders ED Discharge Orders          Ordered    naproxen (NAPROSYN) 500 MG tablet  2 times daily        04/23/22 0838    HYDROcodone-acetaminophen (NORCO/VICODIN) 5-325 MG tablet  Every 4 hours PRN        04/23/22 9417              Jacalyn Lefevre, MD 04/23/22 [phone]

## 2022-04-23 NOTE — ED Triage Notes (Signed)
Pt from home and missed the last step coming down the stairs and twisted her left ankle. Pt states that she can't bare weight on same.

## 2022-04-24 ENCOUNTER — Ambulatory Visit: Payer: Medicare PPO | Admitting: Surgical

## 2022-04-24 ENCOUNTER — Ambulatory Visit (INDEPENDENT_AMBULATORY_CARE_PROVIDER_SITE_OTHER): Payer: Medicare PPO

## 2022-04-24 DIAGNOSIS — S82892A Other fracture of left lower leg, initial encounter for closed fracture: Secondary | ICD-10-CM

## 2022-04-24 DIAGNOSIS — W19XXXA Unspecified fall, initial encounter: Secondary | ICD-10-CM | POA: Diagnosis not present

## 2022-04-24 DIAGNOSIS — M25572 Pain in left ankle and joints of left foot: Secondary | ICD-10-CM

## 2022-04-24 DIAGNOSIS — E119 Type 2 diabetes mellitus without complications: Secondary | ICD-10-CM | POA: Diagnosis not present

## 2022-04-24 DIAGNOSIS — M85859 Other specified disorders of bone density and structure, unspecified thigh: Secondary | ICD-10-CM | POA: Diagnosis not present

## 2022-04-24 DIAGNOSIS — I10 Essential (primary) hypertension: Secondary | ICD-10-CM | POA: Diagnosis not present

## 2022-04-24 DIAGNOSIS — E1151 Type 2 diabetes mellitus with diabetic peripheral angiopathy without gangrene: Secondary | ICD-10-CM | POA: Diagnosis not present

## 2022-04-25 ENCOUNTER — Encounter: Payer: Self-pay | Admitting: Surgical

## 2022-04-25 NOTE — Progress Notes (Signed)
Office Visit Note   Patient: Heather Hart           Date of Birth: 02-08-1951           MRN: [id number] Visit Date: 04/24/2022 Requested by: Leeroy Cha, MD [address] [place],  [place] [address] PCP: Leeroy Cha, MD  Subjective: Chief Complaint  Patient presents with   Left Ankle - Pain    HPI: Heather Hart is a 71 y.o. female who presents to the office reporting left ankle pain.  Patient states that she missed a step while descending stairs on Wednesday evening.  She sustained inversion injury to the left ankle with onset of severe primarily lateral sided pain.  She was seen at [facility] emergency department the following day where she had radiographs demonstrating lateral malleolus fracture.  She states she has no history of prior pain or injury to this ankle.  No history of smoking, low vitamin D, blood thinner use.  Does take a baby aspirin daily.  She does have history of diabetes that is well-controlled with A1c less than 6.  She also has a history of peripheral arterial disease that required a stent in the past but has not given her any difficulties recently and she denies any history of any arterial ulcers.  No other major medical problems.  She has been placed in a boot and has been nonweightbearing.  Taking Tylenol and naproxen for pain control..                ROS: All systems reviewed are negative as they relate to the chief complaint within the history of present illness.  Patient denies fevers or chills.  Assessment & Plan: Visit Diagnoses:  1. Closed fracture of left ankle, initial encounter   2. Pain in left ankle and joints of left foot     Plan: Patient is a 71 year old female who presents for evaluation of left ankle injury.  She has lateral malleolus fracture with what appears to be avulsion fracture off the medial malleolus.  She also has increased medial clear space that is identifiable on standard radiographs and  is increased with stress radiographs that were taken today.  After discussion of options, recommended proceeding with surgical intervention for better anatomic alignment of the ankle joint.  Discussed the risks and benefits of the procedure including but not limited to the risk of nerve/blood vessel damage, ankle stiffness, ankle instability, need for revision surgery in the future, medical complication from surgery such as DVT/PE, infection, wound healing complications.  After discussion, she would like to proceed with surgery still.  She understands that there will be a period of nonweightbearing of likely 4 to 6 weeks after surgery.  She will be posted for next Tuesday with surgery with Dr. Marlou Sa.  She does have a history of diabetes that is well-controlled.  She has a history of peripheral vascular disease with most recent CT angiogram in 2018 showed occlusion of the left common iliac artery with patent flow distally. ABIs in 2020 on left were 0.59.    Follow-Up Instructions: No follow-ups on file.   Orders:  Orders Placed This Encounter  Procedures   XR Ankle 2 Views Left   No orders of the defined types were placed in this encounter.     Procedures: No procedures performed   Clinical Data: No additional findings.  Objective: Vital Signs: There were no vitals taken for this visit.  Physical Exam:  Constitutional: Patient appears  well-developed HEENT:  Head: Normocephalic Eyes:EOM are normal Neck: Normal range of motion Cardiovascular: Normal rate Pulmonary/chest: Effort normal Neurologic: Patient is alert Skin: Skin is warm Psychiatric: Patient has normal mood and affect  Ortho Exam: Ortho exam demonstrates left foot with no palpable DP pulse. PT pulse not palpable.  She has swelling over both the lateral and medial aspects of the ankle.  She has tenderness over the lateral malleolus moderately and mild to moderate tenderness over the medial malleolus.  Intact ankle  dorsiflexion, plantarflexion, inversion, eversion.  Achilles tendon is palpable and intact.  She has no proximal fibular tenderness.  No tenderness throughout the tibial shaft through the proximal two thirds.  No knee effusion present.  No pain with hip range of motion.  Intact cap refill to the left lower extremity foot.  Specialty Comments:  No specialty comments available.  Imaging: No results found.   PMFS History: Patient Active Problem List   Diagnosis Date Noted   Essential hypertension 11/16/2014   Hyperlipemia 11/16/2014   Diabetes mellitus ([place]) 11/16/2014   PAD (peripheral artery disease) ([place]) 05/24/2013   Aftercare following surgery of the circulatory system, NEC 05/26/2012   Peripheral vascular disease, unspecified ([place]) 05/26/2012   Past Medical History:  Diagnosis Date   Diabetes mellitus    Diet and exercise   Hyperlipidemia    Hypertension    Peripheral arterial disease ([place])    Peripheral vascular disease ([place])     Family History  Problem Relation Age of Onset   Heart attack Brother    Cancer Brother    Hypertension Brother    Heart disease Brother        Heart Disease before age 39   Hypertension Mother    Stroke Mother    Hypertension Father    Heart disease Father        Heart Disease before age 19   Heart attack Father    Cancer Sister    Stroke Maternal Grandmother    Cancer Paternal Grandmother    Hypertension Son    Breast cancer Neg Hx     Past Surgical History:  Procedure Laterality Date   aortogram  05/07/2008, 06/22/2005   CESAREAN SECTION  1976  and 1979   COLONOSCOPY WITH PROPOFOL N/A 09/25/2014   Procedure: COLONOSCOPY WITH PROPOFOL;  Surgeon: Garlan Fair, MD;  Location: [facility] ENDOSCOPY;  Service: Endoscopy;  Laterality: N/A;   PARTIAL HYSTERECTOMY     stenting of right common iliac artery   05/07/2008   Social History   Occupational History   Not on file  Tobacco Use   Smoking status: Former    Packs/day: 0.25    Years:  20.00    Total pack years: 5.00    Types: Cigarettes    Quit date: 11/26/1996    Years since quitting: 25.4   Smokeless tobacco: Never  Vaping Use   Vaping Use: Never used  Substance and Sexual Activity   Alcohol use: Not Currently   Drug use: No   Sexual activity: Not on file

## 2022-04-27 ENCOUNTER — Ambulatory Visit: Payer: Medicare PPO | Admitting: Orthopedic Surgery

## 2022-04-27 ENCOUNTER — Ambulatory Visit (INDEPENDENT_AMBULATORY_CARE_PROVIDER_SITE_OTHER): Payer: Medicare PPO

## 2022-04-27 ENCOUNTER — Telehealth: Payer: Self-pay | Admitting: Orthopedic Surgery

## 2022-04-27 DIAGNOSIS — M25572 Pain in left ankle and joints of left foot: Secondary | ICD-10-CM

## 2022-04-27 NOTE — Telephone Encounter (Signed)
Patient called advised that [facility] need a diagnosis code before she can get the Rolator. Patient asked if the code can be e-mailed to [facility]    The email address  is [email or web address]. The number to contact patient is [phone]

## 2022-04-28 ENCOUNTER — Encounter (HOSPITAL_COMMUNITY): Admission: RE | Payer: Self-pay | Source: Home / Self Care

## 2022-04-28 ENCOUNTER — Ambulatory Visit (HOSPITAL_COMMUNITY): Admission: RE | Admit: 2022-04-28 | Payer: Medicare PPO | Source: Home / Self Care | Admitting: Orthopedic Surgery

## 2022-04-28 ENCOUNTER — Encounter: Payer: Self-pay | Admitting: Orthopedic Surgery

## 2022-04-28 SURGERY — OPEN REDUCTION INTERNAL FIXATION (ORIF) ANKLE FRACTURE
Anesthesia: General | Site: Ankle | Laterality: Left

## 2022-04-28 NOTE — Telephone Encounter (Signed)
Order e-mailed

## 2022-04-28 NOTE — Progress Notes (Unsigned)
Post-Op Visit Note   Patient: Heather Hart           Date of Birth: Nov 10, 1950           MRN: [id number] Visit Date: 04/27/2022 PCP: Lorenda Ishihara, MD   Assessment & Plan:  Chief Complaint:  Chief Complaint  Patient presents with   Left Ankle - Pain   Visit Diagnoses:  1. Pain in left ankle and joints of left foot     Plan: Aliviah is a 71 year old patient with left lateral malleolus ankle fracture.  A little bit of widening of the clear space on prior radiographs.  Patient also has peripheral artery disease.  Presents now for splint removal and cast application.  Decision today was for or against operative intervention but with diminished pulses we will try to see if we can get this reduced manually and then avoid the risk of surgical goal intervention and possible delayed wound healing.  Splint is removed today.  Patient has both medial and lateral sided tenderness.  A very well-padded cast is applied and manipulative force applied in order to reduce the fracture with plantarflexion and medially directed force.  Postreduction radiographs demonstrate symmetric ankle mortise and improved alignment of the lateral malleolar fracture.  We will continue this for 2 weeks with repeat radiographs at that time.  No weightbearing and aspirin for DVT prophylaxis.  Follow-up with repeat radiographs in the cast in 2 weeks.  Continue with aspirin for DVT prophylaxis  Follow-Up Instructions: No follow-ups on file.   Orders:  Orders Placed This Encounter  Procedures   XR Ankle Complete Left   No orders of the defined types were placed in this encounter.   Imaging: XR Ankle Complete Left  Result Date: 04/28/2022 AP lateral mortise radiographs left ankle reviewed.  Cast has been applied.  Medial clear space is symmetric within the mortise with no widening.  Lateral malleolar fracture alignment improved compared to prior radiographs.   PMFS History: Patient Active Problem List    Diagnosis Date Noted   Essential hypertension 11/16/2014   Hyperlipemia 11/16/2014   Diabetes mellitus (HCC) 11/16/2014   PAD (peripheral artery disease) (HCC) 05/24/2013   Aftercare following surgery of the circulatory system, NEC 05/26/2012   Peripheral vascular disease, unspecified (HCC) 05/26/2012   Past Medical History:  Diagnosis Date   Diabetes mellitus    Diet and exercise   Hyperlipidemia    Hypertension    Peripheral arterial disease (HCC)    Peripheral vascular disease (HCC)     Family History  Problem Relation Age of Onset   Heart attack Brother    Cancer Brother    Hypertension Brother    Heart disease Brother        Heart Disease before age 10   Hypertension Mother    Stroke Mother    Hypertension Father    Heart disease Father        Heart Disease before age 40   Heart attack Father    Cancer Sister    Stroke Maternal Grandmother    Cancer Paternal Grandmother    Hypertension Son    Breast cancer Neg Hx     Past Surgical History:  Procedure Laterality Date   aortogram  05/07/2008, 06/22/2005   CESAREAN SECTION  1976  and 1979   COLONOSCOPY WITH PROPOFOL N/A 09/25/2014   Procedure: COLONOSCOPY WITH PROPOFOL;  Surgeon: Charolett Bumpers, MD;  Location: [facility];  Service: Endoscopy;  Laterality: N/A;   PARTIAL  HYSTERECTOMY     stenting of right common iliac artery   05/07/2008   Social History   Occupational History   Not on file  Tobacco Use   Smoking status: Former    Packs/day: 0.25    Years: 20.00    Total pack years: 5.00    Types: Cigarettes    Quit date: 11/26/1996    Years since quitting: 25.4   Smokeless tobacco: Never  Vaping Use   Vaping Use: Never used  Substance and Sexual Activity   Alcohol use: Not Currently   Drug use: No   Sexual activity: Not on file

## 2022-05-13 ENCOUNTER — Ambulatory Visit (INDEPENDENT_AMBULATORY_CARE_PROVIDER_SITE_OTHER): Payer: Medicare PPO

## 2022-05-13 ENCOUNTER — Ambulatory Visit (INDEPENDENT_AMBULATORY_CARE_PROVIDER_SITE_OTHER): Payer: Medicare PPO | Admitting: Surgical

## 2022-05-13 DIAGNOSIS — S82892A Other fracture of left lower leg, initial encounter for closed fracture: Secondary | ICD-10-CM | POA: Diagnosis not present

## 2022-05-13 DIAGNOSIS — M25572 Pain in left ankle and joints of left foot: Secondary | ICD-10-CM

## 2022-05-16 ENCOUNTER — Encounter: Payer: Self-pay | Admitting: Surgical

## 2022-05-16 NOTE — Progress Notes (Signed)
Post-fracture visit Note   Patient: Heather Hart           Date of Birth: 1950-09-13           MRN: [id number] Visit Date: 05/13/2022 PCP: Leeroy Cha, MD   Assessment & Plan:  Chief Complaint:  Chief Complaint  Patient presents with   Left Ankle - Follow-up   Visit Diagnoses:  1. Pain in left ankle and joints of left foot   2. Closed fracture of left ankle, initial encounter     Plan: Patient is a 71 year old female who returns for reevaluation of left lateral malleolus ankle fracture.  She has been in a cast since 04/27/2022.  She states that she really does not have any pain or discomfort.  She has been ambulating with the use of a rollator and maintaining nonweightbearing status.  She had radiographs of the left ankle taken today demonstrating well aligned ankle fracture with fairly symmetric ankle mortise.  There is no callus formation that is noted yet on radiographs today.  Plan is to continue with cast immobilization and patient will follow-up next week for cast removal around the 3-week mark from her cast placement.  We will examine the soft tissue and skin around the lower extremity to ensure there is no ulceration.  Repeat radiographs at that time and if there is callus formation noted, we may transition to fracture boot.  If there is no callus formation still at that point, plan to return into a new well-padded cast.  Patient understands and agrees with this plan.  Follow-up on 05/21/2022  Follow-Up Instructions: No follow-ups on file.   Orders:  Orders Placed This Encounter  Procedures   XR Ankle Complete Left   No orders of the defined types were placed in this encounter.   Imaging: No results found.  PMFS History: Patient Active Problem List   Diagnosis Date Noted   Essential hypertension 11/16/2014   Hyperlipemia 11/16/2014   Diabetes mellitus ([place]) 11/16/2014   PAD (peripheral artery disease) ([place]) 05/24/2013   Aftercare following  surgery of the circulatory system, NEC 05/26/2012   Peripheral vascular disease, unspecified ([place]) 05/26/2012   Past Medical History:  Diagnosis Date   Diabetes mellitus    Diet and exercise   Hyperlipidemia    Hypertension    Peripheral arterial disease ([place])    Peripheral vascular disease ([place])     Family History  Problem Relation Age of Onset   Heart attack Brother    Cancer Brother    Hypertension Brother    Heart disease Brother        Heart Disease before age 85   Hypertension Mother    Stroke Mother    Hypertension Father    Heart disease Father        Heart Disease before age 72   Heart attack Father    Cancer Sister    Stroke Maternal Grandmother    Cancer Paternal Grandmother    Hypertension Son    Breast cancer Neg Hx     Past Surgical History:  Procedure Laterality Date   aortogram  05/07/2008, 06/22/2005   CESAREAN SECTION  1976  and 1979   COLONOSCOPY WITH PROPOFOL N/A 09/25/2014   Procedure: COLONOSCOPY WITH PROPOFOL;  Surgeon: Garlan Fair, MD;  Location: [facility];  Service: Endoscopy;  Laterality: N/A;   PARTIAL HYSTERECTOMY     stenting of right common iliac artery   05/07/2008   Social History   Occupational History  Not on file  Tobacco Use   Smoking status: Former    Packs/day: 0.25    Years: 20.00    Total pack years: 5.00    Types: Cigarettes    Quit date: 11/26/1996    Years since quitting: 25.4   Smokeless tobacco: Never  Vaping Use   Vaping Use: Never used  Substance and Sexual Activity   Alcohol use: Not Currently   Drug use: No   Sexual activity: Not on file

## 2022-05-21 ENCOUNTER — Encounter: Payer: Self-pay | Admitting: Surgical

## 2022-05-21 ENCOUNTER — Ambulatory Visit: Payer: Medicare PPO | Admitting: Surgical

## 2022-05-21 ENCOUNTER — Ambulatory Visit (INDEPENDENT_AMBULATORY_CARE_PROVIDER_SITE_OTHER): Payer: Medicare PPO

## 2022-05-21 DIAGNOSIS — S82892A Other fracture of left lower leg, initial encounter for closed fracture: Secondary | ICD-10-CM | POA: Diagnosis not present

## 2022-05-21 NOTE — Progress Notes (Signed)
Post-Op Visit Note   Patient: Heather Hart           Date of Birth: 17-Apr-1951           MRN: [id number] Visit Date: 05/21/2022 PCP: Lorenda Ishihara, MD   Assessment & Plan:  Chief Complaint:  Chief Complaint  Patient presents with   Left Ankle - Follow-up, Fracture   Visit Diagnoses:  1. Closed fracture of left ankle, initial encounter     Plan: Patient is a 71 year old female who returns for reevaluation of left ankle fracture.  She has been immobilized in a nonweightbearing cast.  Cast removed as it has been about 3 weeks at this point.  The left ankle was inspected for any sign of pressure injury or ulceration and found no evidence of ulceration.  She has substantially improved swelling as well as improved tenderness over the medial and lateral malleoli.  Radiographs demonstrate fracture is in good position with no evidence of widening of the medial clear space compared with the previous radiographs prior to casting.  However there is no callus formation that is identifiable on x-ray today.  With lack of callus formation, plan to return to new cast.  This was applied and patient tolerated casting well.  New radiographs obtained demonstrating fracture appears in good alignment.  Follow-up in 2 and half weeks for cast removal and new radiographs at that time.  No weightbearing until then.  Patient agreed with plan.  Follow-Up Instructions: No follow-ups on file.   Orders:  Orders Placed This Encounter  Procedures   XR Ankle Complete Left   No orders of the defined types were placed in this encounter.   Imaging: No results found.  PMFS History: Patient Active Problem List   Diagnosis Date Noted   Essential hypertension 11/16/2014   Hyperlipemia 11/16/2014   Diabetes mellitus (HCC) 11/16/2014   PAD (peripheral artery disease) (HCC) 05/24/2013   Aftercare following surgery of the circulatory system, NEC 05/26/2012   Peripheral vascular disease, unspecified  (HCC) 05/26/2012   Past Medical History:  Diagnosis Date   Diabetes mellitus    Diet and exercise   Hyperlipidemia    Hypertension    Peripheral arterial disease (HCC)    Peripheral vascular disease (HCC)     Family History  Problem Relation Age of Onset   Heart attack Brother    Cancer Brother    Hypertension Brother    Heart disease Brother        Heart Disease before age 64   Hypertension Mother    Stroke Mother    Hypertension Father    Heart disease Father        Heart Disease before age 70   Heart attack Father    Cancer Sister    Stroke Maternal Grandmother    Cancer Paternal Grandmother    Hypertension Son    Breast cancer Neg Hx     Past Surgical History:  Procedure Laterality Date   aortogram  05/07/2008, 06/22/2005   CESAREAN SECTION  1976  and 1979   COLONOSCOPY WITH PROPOFOL N/A 09/25/2014   Procedure: COLONOSCOPY WITH PROPOFOL;  Surgeon: Charolett Bumpers, MD;  Location: [facility];  Service: Endoscopy;  Laterality: N/A;   PARTIAL HYSTERECTOMY     stenting of right common iliac artery   05/07/2008   Social History   Occupational History   Not on file  Tobacco Use   Smoking status: Former    Packs/day: 0.25    Years:  20.00    Total pack years: 5.00    Types: Cigarettes    Quit date: 11/26/1996    Years since quitting: 25.4   Smokeless tobacco: Never  Vaping Use   Vaping Use: Never used  Substance and Sexual Activity   Alcohol use: Not Currently   Drug use: No   Sexual activity: Not on file

## 2022-05-22 DIAGNOSIS — E119 Type 2 diabetes mellitus without complications: Secondary | ICD-10-CM | POA: Diagnosis not present

## 2022-06-08 ENCOUNTER — Ambulatory Visit (INDEPENDENT_AMBULATORY_CARE_PROVIDER_SITE_OTHER): Payer: Medicare PPO

## 2022-06-08 ENCOUNTER — Ambulatory Visit: Payer: Medicare PPO | Admitting: Surgical

## 2022-06-08 DIAGNOSIS — S82892A Other fracture of left lower leg, initial encounter for closed fracture: Secondary | ICD-10-CM

## 2022-06-09 LAB — VITAMIN D 25 HYDROXY (VIT D DEFICIENCY, FRACTURES): Vit D, 25-Hydroxy: 48 ng/mL (ref 30–100)

## 2022-06-11 ENCOUNTER — Encounter: Payer: Self-pay | Admitting: Surgical

## 2022-06-11 NOTE — Progress Notes (Signed)
Post-fracture visit Note   Patient: Heather Hart           Date of Birth: 1950/09/29           MRN: [id number] Visit Date: 06/08/2022 PCP: Leeroy Cha, MD   Assessment & Plan:  Chief Complaint:  Chief Complaint  Patient presents with   Left Ankle - Pain   Visit Diagnoses:  1. Closed fracture of left ankle, initial encounter     Plan: Patient is a 72 year old female who presents s/p left ankle fracture.  Date of injury was around 04/23/2022.  She has been nonweightbearing in cast.  Initially had widening of the medial clear space but passed several radiographs that showed an this has improved.  She was taken out of cast today with radiographs demonstrating stable appearance of the fracture with no significant displacement.  There is no callus formation noted yet about 6 weeks out from injury.  She has some continued mild tenderness over the fracture site but overall she tolerates palpation over the fracture a lot better than in previous exams.  Skin was evaluated for any ulcerations from cast but no ulcers were found.  With lack of callus formation but improving clinical picture, plan to check vitamin D today to ensure that she is not vitamin D deficient.  May be having some delayed fracture union due to her history of arterial disease.  Plan to set her up for bone stimulator to help optimize fracture healing to prevent nonunion.  She will begin partial weightbearing with the assistance of a rollator in her fracture boot.  Recommended she let us know if she has any severe pain with weightbearing but she was ambulated around the clinic fairly well today.  Follow-up in 10 days for clinical recheck with new radiographs at that time to ensure there is no fracture displacement with her new weightbearing status.  Follow-Up Instructions: No follow-ups on file.   Orders:  Orders Placed This Encounter  Procedures   XR Ankle Complete Left   Vitamin D (25 hydroxy)   No orders  of the defined types were placed in this encounter.   Imaging: No results found.  PMFS History: Patient Active Problem List   Diagnosis Date Noted   Essential hypertension 11/16/2014   Hyperlipemia 11/16/2014   Diabetes mellitus ([place]) 11/16/2014   PAD (peripheral artery disease) ([place]) 05/24/2013   Aftercare following surgery of the circulatory system, NEC 05/26/2012   Peripheral vascular disease, unspecified ([place]) 05/26/2012   Past Medical History:  Diagnosis Date   Diabetes mellitus    Diet and exercise   Hyperlipidemia    Hypertension    Peripheral arterial disease ([place])    Peripheral vascular disease ([place])     Family History  Problem Relation Age of Onset   Heart attack Brother    Cancer Brother    Hypertension Brother    Heart disease Brother        Heart Disease before age 74   Hypertension Mother    Stroke Mother    Hypertension Father    Heart disease Father        Heart Disease before age 31   Heart attack Father    Cancer Sister    Stroke Maternal Grandmother    Cancer Paternal Grandmother    Hypertension Son    Breast cancer Neg Hx     Past Surgical History:  Procedure Laterality Date   aortogram  05/07/2008, 06/22/2005   [place]  and 1979   COLONOSCOPY WITH PROPOFOL N/A 09/25/2014   Procedure: COLONOSCOPY WITH PROPOFOL;  Surgeon: Garlan Fair, MD;  Location: [facility];  Service: Endoscopy;  Laterality: N/A;   PARTIAL HYSTERECTOMY     stenting of right common iliac artery   05/07/2008   Social History   Occupational History   Not on file  Tobacco Use   Smoking status: Former    Packs/day: 0.25    Years: 20.00    Total pack years: 5.00    Types: Cigarettes    Quit date: 11/26/1996    Years since quitting: 25.5   Smokeless tobacco: Never  Vaping Use   Vaping Use: Never used  Substance and Sexual Activity   Alcohol use: Not Currently   Drug use: No   Sexual activity: Not on file

## 2022-06-22 ENCOUNTER — Ambulatory Visit: Payer: Medicare PPO | Admitting: Surgical

## 2022-06-22 ENCOUNTER — Ambulatory Visit (INDEPENDENT_AMBULATORY_CARE_PROVIDER_SITE_OTHER): Payer: Medicare PPO

## 2022-06-22 DIAGNOSIS — S82892A Other fracture of left lower leg, initial encounter for closed fracture: Secondary | ICD-10-CM

## 2022-06-26 ENCOUNTER — Encounter: Payer: Self-pay | Admitting: Surgical

## 2022-06-26 NOTE — Progress Notes (Signed)
Post-fracture visit Note   Patient: Heather Hart           Date of Birth: 1951/04/18           MRN: [id number] Visit Date: 06/22/2022 PCP: Leeroy Cha, MD   Assessment & Plan:  Chief Complaint:  Chief Complaint  Patient presents with   Left Ankle - Pain   Visit Diagnoses:  1. Closed fracture of left ankle, initial encounter     Plan: Patient is a 72 year old female who returns for reevaluation of left ankle fracture.  She has been weightbearing in fracture boot and states she has not having any pain when she walks around.  She is doing therapy exercises at home primarily focusing on achieving more ankle dorsiflexion.  She feels like she is improving more and more.  She is weightbearing in fracture boot and has not been doing any weightbearing outside of the fracture boot.  Taking vitamin D every day.  Does not have any take anything for pain control.  No calf pain.  On exam, patient has ankle dorsiflexion to about 5 to 7 degrees.  No calf tenderness.  Negative Homans' sign.  She does have mild tenderness over the medial and lateral malleolar I.  No palpable pulse of the DP or PT pulses.  Syndesmosis is stable.  Intact active ankle dorsiflexion and plantarflexion.  Plan is to order CT scan of left ankle for further evaluation of nonunion in about 1 to 2 weeks.  She has radiographs taken today demonstrating really no significant evidence of fracture healing since the prior radiographs.  Fortunately the ankle fracture has not displaced with her weightbearing and with her minimal discomfort while weightbearing, I think it is okay for her to continue weightbearing but with the lack of callus formation noted, want her to hold off on discontinuing fracture boot.  She will continue taking vitamin D.  We will see what the CT scan shows and have her follow-up with Dr. Marlou Sa to review CT scan.  We also attempted to get her approved for bone stimulator but this cannot be approved by  her insurance until about 3 months out from the initial injury date so we will wait until then.  Follow-Up Instructions: No follow-ups on file.   Orders:  Orders Placed This Encounter  Procedures   XR Ankle Complete Left   CT ANKLE LEFT WO CONTRAST   No orders of the defined types were placed in this encounter.   Imaging: No results found.  PMFS History: Patient Active Problem List   Diagnosis Date Noted   Essential hypertension 11/16/2014   Hyperlipemia 11/16/2014   Diabetes mellitus ([place]) 11/16/2014   PAD (peripheral artery disease) ([place]) 05/24/2013   Aftercare following surgery of the circulatory system, NEC 05/26/2012   Peripheral vascular disease, unspecified ([place]) 05/26/2012   Past Medical History:  Diagnosis Date   Diabetes mellitus    Diet and exercise   Hyperlipidemia    Hypertension    Peripheral arterial disease ([place])    Peripheral vascular disease ([place])     Family History  Problem Relation Age of Onset   Heart attack Brother    Cancer Brother    Hypertension Brother    Heart disease Brother        Heart Disease before age 64   Hypertension Mother    Stroke Mother    Hypertension Father    Heart disease Father        Heart Disease before age 43  Heart attack Father    Cancer Sister    Stroke Maternal Grandmother    Cancer Paternal Grandmother    Hypertension Son    Breast cancer Neg Hx     Past Surgical History:  Procedure Laterality Date   aortogram  05/07/2008, 06/22/2005   CESAREAN SECTION  1976  and 1979   COLONOSCOPY WITH PROPOFOL N/A 09/25/2014   Procedure: COLONOSCOPY WITH PROPOFOL;  Surgeon: Garlan Fair, MD;  [facility]: [facility];  Service: Endoscopy;  Laterality: N/A;   PARTIAL HYSTERECTOMY     stenting of right common iliac artery   05/07/2008   Social History   Occupational History   Not on file  Tobacco Use   Smoking status: Former    Packs/day: 0.25    Years: 20.00    Total pack years: 5.00    Types: Cigarettes     Quit date: 11/26/1996    Years since quitting: 25.5   Smokeless tobacco: Never  Vaping Use   Vaping Use: Never used  Substance and Sexual Activity   Alcohol use: Not Currently   Drug use: No   Sexual activity: Not on file

## 2022-07-03 ENCOUNTER — Other Ambulatory Visit: Payer: Self-pay | Admitting: Internal Medicine

## 2022-07-03 DIAGNOSIS — M8588 Other specified disorders of bone density and structure, other site: Secondary | ICD-10-CM | POA: Diagnosis not present

## 2022-07-03 DIAGNOSIS — I739 Peripheral vascular disease, unspecified: Secondary | ICD-10-CM | POA: Diagnosis not present

## 2022-07-03 DIAGNOSIS — F3341 Major depressive disorder, recurrent, in partial remission: Secondary | ICD-10-CM | POA: Diagnosis not present

## 2022-07-03 DIAGNOSIS — E1151 Type 2 diabetes mellitus with diabetic peripheral angiopathy without gangrene: Secondary | ICD-10-CM | POA: Diagnosis not present

## 2022-07-03 DIAGNOSIS — I10 Essential (primary) hypertension: Secondary | ICD-10-CM | POA: Diagnosis not present

## 2022-07-03 DIAGNOSIS — S92902A Unspecified fracture of left foot, initial encounter for closed fracture: Secondary | ICD-10-CM | POA: Diagnosis not present

## 2022-07-06 ENCOUNTER — Ambulatory Visit
Admission: RE | Admit: 2022-07-06 | Discharge: 2022-07-06 | Disposition: A | Discharge status: Home / Self Care | Payer: Medicare PPO | Source: Ambulatory Visit | Attending: Surgical | Admitting: Surgical

## 2022-07-06 DIAGNOSIS — S82892A Other fracture of left lower leg, initial encounter for closed fracture: Secondary | ICD-10-CM

## 2022-07-06 DIAGNOSIS — S82832A Other fracture of upper and lower end of left fibula, initial encounter for closed fracture: Secondary | ICD-10-CM | POA: Diagnosis not present

## 2022-07-06 DIAGNOSIS — S8252XA Displaced fracture of medial malleolus of left tibia, initial encounter for closed fracture: Secondary | ICD-10-CM | POA: Diagnosis not present

## 2022-07-06 DIAGNOSIS — S92152A Displaced avulsion fracture (chip fracture) of left talus, initial encounter for closed fracture: Secondary | ICD-10-CM | POA: Diagnosis not present

## 2022-07-20 ENCOUNTER — Ambulatory Visit: Payer: Medicare PPO | Admitting: Surgical

## 2022-07-20 DIAGNOSIS — S82892A Other fracture of left lower leg, initial encounter for closed fracture: Secondary | ICD-10-CM

## 2022-07-26 ENCOUNTER — Encounter: Payer: Self-pay | Admitting: Surgical

## 2022-07-26 NOTE — Progress Notes (Signed)
Office Visit Note   Patient: Heather Hart           Date of Birth: May 08, 1951           MRN: [id number] Visit Date: 07/20/2022 Requested by: Leeroy Cha, MD [address] [place],  [place] [id number] PCP: Leeroy Cha, MD  Subjective: Chief Complaint  Patient presents with   Other     Scan review    HPI: Heather Hart is a 72 y.o. female who presents to the office for MRI review. Patient denies any changes in symptoms.  Continues to complain mainly of mild ankle discomfort.  She is ambulatory in a fracture boot still and states that she has no pain with ambulation.  No new injury.  She denies any vascular claudication symptoms over the last several years.  CT scan results reviewed: IMPRESSION: 1. Ununited fibular fracture. 2. Small avulsion fracture between the medial malleolus and the medial talus. 3. Moderate ankle joint degenerative changes.               ROS: All systems reviewed are negative as they relate to the chief complaint within the history of present illness.  Patient denies fevers or chills.  Assessment & Plan: Visit Diagnoses:  1. Closed fracture of left ankle, initial encounter     Plan: Heather Hart is a 72 y.o. female who presents to the office for review of CT scan of the left ankle.  CT scan demonstrates no fracture healing of the lateral malleolus fracture despite 6 weeks of nonweightbearing cast immobilization and subsequent fracture boot immobilization.  Suspect that this is primarily due to history of peripheral vascular disease.  Last CT angiogram in 2018 showed closure of the left common iliac artery.  ABIs most recently in 2020 on the left were 0.59.  She has seen Dr. Scot Dock in the past.  I discussed the patient with Dr. Sharol Given who recommended further evaluation by Dr. Scot Dock for interventions/options regarding her peripheral vascular disease to optimize her ability to heal this ankle fracture.  Also  plan to refer patient to Dr. Sharol Given for his formal evaluation.  Now that she is about 3 months out from her initial injury, we will also reach out to [employer] to set up bone stimulator that should be covered now that she is 3 months out and has continued nonunion.  Follow-Up Instructions: No follow-ups on file.   Orders:  No orders of the defined types were placed in this encounter.  No orders of the defined types were placed in this encounter.     Procedures: No procedures performed   Clinical Data: No additional findings.  Objective: Vital Signs: There were no vitals taken for this visit.  Physical Exam:  Constitutional: Patient appears well-developed HEENT:  Head: Normocephalic Eyes:EOM are normal Neck: Normal range of motion Cardiovascular: Normal rate Pulmonary/chest: Effort normal Neurologic: Patient is alert Skin: Skin is warm Psychiatric: Patient has normal mood and affect  Ortho Exam: Ortho exam demonstrates left ankle and foot with no palpable pulse.  She has no calf tenderness.  Negative Homans' sign.  No significant medial malleolus tenderness.  She does have a little bit of tenderness over the lateral malleolus that she rates to/10.  Mild swelling noted throughout the left ankle on the lateral side.  She has intact ankle dorsiflexion, plantarflexion, inversion, eversion rated 5/5.  Achilles tendon is palpable and in tact.  Anterior tibialis tendon is palpable intact.  Specialty Comments:  No specialty comments available.  Imaging: No results found.   PMFS History: Patient Active Problem List   Diagnosis Date Noted   Essential hypertension 11/16/2014   Hyperlipemia 11/16/2014   Diabetes mellitus ([place]) 11/16/2014   PAD (peripheral artery disease) ([place]) 05/24/2013   Aftercare following surgery of the circulatory system, NEC 05/26/2012   Peripheral vascular disease, unspecified ([place]) 05/26/2012   Past Medical History:  Diagnosis Date   Diabetes mellitus     Diet and exercise   Hyperlipidemia    Hypertension    Peripheral arterial disease ([place])    Peripheral vascular disease ([place])     Family History  Problem Relation Age of Onset   Heart attack Brother    Cancer Brother    Hypertension Brother    Heart disease Brother        Heart Disease before age 48   Hypertension Mother    Stroke Mother    Hypertension Father    Heart disease Father        Heart Disease before age 36   Heart attack Father    Cancer Sister    Stroke Maternal Grandmother    Cancer Paternal Grandmother    Hypertension Son    Breast cancer Neg Hx     Past Surgical History:  Procedure Laterality Date   aortogram  05/07/2008, 06/22/2005   CESAREAN SECTION  1976  and 1979   COLONOSCOPY WITH PROPOFOL N/A 09/25/2014   Procedure: COLONOSCOPY WITH PROPOFOL;  Surgeon: Garlan Fair, MD;  [facility]: [facility];  Service: Endoscopy;  Laterality: N/A;   PARTIAL HYSTERECTOMY     stenting of right common iliac artery   05/07/2008   Social History   Occupational History   Not on file  Tobacco Use   Smoking status: Former    Packs/day: 0.25    Years: 20.00    Total pack years: 5.00    Types: Cigarettes    Quit date: 11/26/1996    Years since quitting: 25.6   Smokeless tobacco: Never  Vaping Use   Vaping Use: Never used  Substance and Sexual Activity   Alcohol use: Not Currently   Drug use: No   Sexual activity: Not on file

## 2022-07-27 ENCOUNTER — Other Ambulatory Visit: Payer: Self-pay

## 2022-07-27 DIAGNOSIS — M25572 Pain in left ankle and joints of left foot: Secondary | ICD-10-CM

## 2022-07-27 DIAGNOSIS — S82892A Other fracture of left lower leg, initial encounter for closed fracture: Secondary | ICD-10-CM

## 2022-07-29 ENCOUNTER — Other Ambulatory Visit: Payer: Self-pay | Admitting: *Deleted

## 2022-07-29 DIAGNOSIS — I739 Peripheral vascular disease, unspecified: Secondary | ICD-10-CM

## 2022-07-30 ENCOUNTER — Ambulatory Visit: Payer: Medicare PPO | Admitting: Vascular Surgery

## 2022-07-30 ENCOUNTER — Encounter: Payer: Self-pay | Admitting: Vascular Surgery

## 2022-07-30 ENCOUNTER — Ambulatory Visit (HOSPITAL_COMMUNITY)
Admission: RE | Admit: 2022-07-30 | Discharge: 2022-07-30 | Disposition: A | Discharge status: Home / Self Care | Payer: Medicare PPO | Source: Ambulatory Visit | Attending: Vascular Surgery | Admitting: Vascular Surgery

## 2022-07-30 VITALS — BP 185/81 | HR 68 | Temp 98.0°F | Resp 20 | Ht 61.0 in | Wt 145.0 lb

## 2022-07-30 DIAGNOSIS — I739 Peripheral vascular disease, unspecified: Secondary | ICD-10-CM | POA: Diagnosis not present

## 2022-07-30 LAB — VAS US ABI WITH/WO TBI
Left ABI: 0.54
Right ABI: 0.53

## 2022-07-30 NOTE — Progress Notes (Signed)
ASSESSMENT & PLAN   AORTOILIAC OCCLUSIVE DISEASE: This patient has a known distal aortic occlusion since 2018.  She has excellent collateral flow as she is essentially been asymptomatic.  She denies claudication or rest pain.  Her ABI on the left is 54% and her toe pressure is 66 mmHg.  Typically a toe pressure of greater than 50 suggest adequate circulation for healing.  Thus, I would recommend being patient with her ankle fracture before considering revascularization.  Given that the distal aorta is heavily calcified she is likely not a candidate for an endovascular approach as the disease extends up into the infrarenal aorta.  Her options for revascularization would be aortofemoral bypass grafting axillofemoral bypass grafting.  Currently I do not think that is indicated.  I will plan on seeing her back in 6 months with follow-up ABIs at that time.  If there is any concerns we will need a CT angio to further evaluate her aortoiliac occlusive disease.  She knows to call sooner if she has problems.   HPI:   Heather Hart is a 72 y.o. female who fell in November of last year and fractured her left ankle.  She was last seen by orthopedics on 07/20/2022.  At that time his CT scan showed an ununited fibular fracture.  Given the slow healing of her fracture she was sent for vascular consultation.  This patient was diagnosed with a distal aortic occlusion back in 2018.  She had previous iliac stents done elsewhere.  I saw her in July 2020 and she had stable claudication and was highly motivated.  She was then lost to follow-up.  On my history today she denies any claudication.  She denies any history of rest pain or nonhealing ulcers.  Her only issue has been the ankle fracture which has been slow to heal.  Her risk factors for peripheral arterial disease include type 2 diabetes, hypertension, hypercholesterolemia, family history of premature cardiovascular disease and a remote history of tobacco  use.  She quit in 1998.  Past Medical History:  Diagnosis Date   Diabetes mellitus    Diet and exercise   Hyperlipidemia    Hypertension    Peripheral arterial disease ([place])    Peripheral vascular disease ([place])     Family History  Problem Relation Age of Onset   Heart attack Brother    Cancer Brother    Hypertension Brother    Heart disease Brother        Heart Disease before age 75   Hypertension Mother    Stroke Mother    Hypertension Father    Heart disease Father        Heart Disease before age 58   Heart attack Father    Cancer Sister    Stroke Maternal Grandmother    Cancer Paternal Grandmother    Hypertension Son    Breast cancer Neg Hx     SOCIAL HISTORY: Social History   Tobacco Use   Smoking status: Former    Packs/day: 0.25    Years: 20.00    Total pack years: 5.00    Types: Cigarettes    Quit date: 11/26/1996    Years since quitting: 25.6   Smokeless tobacco: Never  Substance Use Topics   Alcohol use: Not Currently    Allergies  Allergen Reactions   Nitrofurantoin Hives    Current Outpatient Medications  Medication Sig Dispense Refill   ALPRAZolam (XANAX) 1 MG tablet Take 1 mg by mouth at  bedtime as needed.     aspirin EC 81 MG tablet Take 81 mg by mouth daily.     benazepril (LOTENSIN) 40 MG tablet Take 40 mg by mouth daily.     Calcium Carbonate-Vitamin D (CALCIUM + D) 600-200 MG-UNIT per tablet Take 1 tablet by mouth 2 (two) times daily.     glimepiride (AMARYL) 4 MG tablet   0   hydrALAZINE (APRESOLINE) 25 MG tablet Take 25 mg by mouth in the morning and at bedtime.     hydrochlorothiazide (HYDRODIURIL) 25 MG tablet Take 25 mg by mouth daily.     HYDROcodone-acetaminophen (NORCO/VICODIN) 5-325 MG tablet Take 1 tablet by mouth every 4 (four) hours as needed. 10 tablet 0   metFORMIN (GLUCOPHAGE-XR) 500 MG 24 hr tablet Take 500 mg by mouth daily. Pt takes 4 tablets in the evening     naproxen (NAPROSYN) 500 MG tablet Take 1 tablet (500 mg  total) by mouth 2 (two) times daily. 30 tablet 0   NIFEdipine (PROCARDIA XL/NIFEDICAL-XL) 90 MG 24 hr tablet Take 1 tablet (90 mg total) by mouth daily. 90 tablet 3   ONETOUCH ULTRA test strip daily. as directed     OZEMPIC, 0.25 OR 0.5 MG/DOSE, 2 MG/1.5ML SOPN [place] AS DIRECTED ONCE WEEKLY     rosuvastatin (CRESTOR) 40 MG tablet Take 40 mg by mouth daily.     No current facility-administered medications for this visit.    REVIEW OF SYSTEMS:  '[X]'$  denotes positive finding, '[ ]'$  denotes negative finding Cardiac  Comments:  Chest pain or chest pressure:    Shortness of breath upon exertion:    Short of breath when lying flat:    Irregular heart rhythm:        Vascular    Pain in calf, thigh, or hip brought on by ambulation:    Pain in feet at night that wakes you up from your sleep:     Blood clot in your veins:    Leg swelling:         Pulmonary    Oxygen at home:    Productive cough:     Wheezing:         Neurologic    Sudden weakness in arms or legs:     Sudden numbness in arms or legs:     Sudden onset of difficulty speaking or slurred speech:    Temporary loss of vision in one eye:     Problems with dizziness:         Gastrointestinal    Blood in stool:     Vomited blood:         Genitourinary    Burning when urinating:     Blood in urine:        Psychiatric    Major depression:         Hematologic    Bleeding problems:    Problems with blood clotting too easily:        Skin    Rashes or ulcers:        Constitutional    Fever or chills:    -  PHYSICAL EXAM:   Vitals:   07/30/22 1325  BP: (!) 185/81  Pulse: 68  Resp: 20  Temp: 98 F (36.7 C)  SpO2: 96%  Weight: 145 lb (65.8 kg)  Height: '5\' 1"'$  (1.549 m)   Body mass index is 27.4 kg/m. GENERAL: The patient is a well-nourished female, in no acute distress. The vital signs are  documented above. CARDIAC: There is a regular rate and rhythm.  VASCULAR: I do not detect carotid bruits. I cannot palpate  femoral pulses. I cannot palpate pedal pulses. She has no significant lower extremity swelling. PULMONARY: There is good air exchange bilaterally without wheezing or rales. ABDOMEN: Soft and non-tender with normal pitched bowel sounds.  MUSCULOSKELETAL: There are no major deformities. NEUROLOGIC: No focal weakness or paresthesias are detected. SKIN: There are no ulcers or rashes noted. PSYCHIATRIC: The patient has a normal affect.  DATA:    ARTERIAL DOPPLER STUDY: I have independently interpreted her arterial Doppler study today.  On the right side there is a monophasic posterior tibial and dorsalis pedis signal.  ABIs 53%.  Toe pressure 70 mmHg.    On the left side there is a monophasic dorsalis pedis and posterior tibial signal.  ABIs 54%.  Toe pressure 66 mmHg.  CT ANGIO ABDOMEN PELVIS: I did review the CT angio of her abdomen pelvis that was done back in 2018.  Her distal aorta was occluded as were her proximal common iliac arteries.  She had significant calcific disease at the aortic bifurcation and proximal common iliac arteries.  She had previously stents in the iliacs.  LABS: There are no recent labs in epic.  Deitra [facility]

## 2022-07-31 ENCOUNTER — Other Ambulatory Visit: Payer: Self-pay

## 2022-07-31 DIAGNOSIS — I739 Peripheral vascular disease, unspecified: Secondary | ICD-10-CM

## 2022-08-06 ENCOUNTER — Ambulatory Visit: Payer: Medicare PPO | Admitting: Orthopedic Surgery

## 2022-08-06 ENCOUNTER — Encounter: Payer: Self-pay | Admitting: Orthopedic Surgery

## 2022-08-06 ENCOUNTER — Other Ambulatory Visit (INDEPENDENT_AMBULATORY_CARE_PROVIDER_SITE_OTHER): Payer: Medicare PPO

## 2022-08-06 DIAGNOSIS — M25572 Pain in left ankle and joints of left foot: Secondary | ICD-10-CM

## 2022-08-06 NOTE — Progress Notes (Signed)
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  Assessment & Plan: Visit Diagnoses:  1. Pain in left ankle and joints of left foot     Plan: Patient will increase her activities as tolerated in regular shoewear around the house advance to shoe wear outside the house as she feels comfortable.  We are still working on obtaining a bone stimulator.  Follow-Up Instructions: Return if symptoms worsen or fail to improve.   Ortho Exam  Patient is alert, oriented, no adenopathy, well-dressed, normal affect, normal respiratory effort. Patient is 3 months status post Weber B left fibular fracture.  She has been treated with immobilization with casting and a fracture boot.  She is currently ambulating in a fracture boot and states she has no pain with ambulation.  Previous CT scan shows a delayed union at the fracture site.  The fibula is nontender to palpation there is no swelling no cellulitis no ulceration.  Imaging: XR Ankle Complete Left  Result Date: 08/06/2022 Three-view radiographs of the left ankle shows a small area of lucency laterally on the fibula, the remainder of the fibular fracture is well opposed without sclerotic changes.  No images are attached to the encounter.  Labs: No results found for: "HGBA1C", "ESRSEDRATE", "CRP", "LABURIC", "REPTSTATUS", "GRAMSTAIN", "CULT", "LABORGA"   No results found for: "ALBUMIN", "PREALBUMIN", "CBC"  No results found for: "MG" Lab Results  Component Value Date   VD25OH 48 06/08/2022    No  results found for: "PREALBUMIN"    Latest Ref Rng & Units 05/07/2008    6:22 AM  CBC EXTENDED  Hemoglobin 12.0 - 15.0 g/dL 13.6   HCT 36.0 - 46.0 % 40.0      There is no height or weight on file to calculate BMI.  Orders:  Orders Placed This Encounter  Procedures   XR Ankle Complete Left   No orders of the defined types were placed in this encounter.    Procedures: No procedures performed  Clinical Data: No additional findings.  ROS:  All other systems negative, except as noted in the HPI. Review of Systems  Objective: Vital Signs: There were no vitals taken for this visit.  Specialty Comments:  No specialty comments available.  PMFS History: Patient Active Problem List   Diagnosis Date Noted   Essential hypertension 11/16/2014   Hyperlipemia 11/16/2014   Diabetes mellitus ([place]) 11/16/2014   PAD (peripheral artery disease) ([place]) 05/24/2013   Aftercare following surgery of the circulatory system, NEC 05/26/2012   Peripheral vascular disease, unspecified ([place]) 05/26/2012   Past Medical History:  Diagnosis Date   Diabetes mellitus    Diet and exercise   Hyperlipidemia    Hypertension    Peripheral arterial disease ([place])    Peripheral vascular disease ([place])     Family History  Problem Relation Age of Onset   Heart attack Brother    Cancer Brother  Hypertension Brother    Heart disease Brother        Heart Disease before age 88   Hypertension Mother    Stroke Mother    Hypertension Father    Heart disease Father        Heart Disease before age 75   Heart attack Father    Cancer Sister    Stroke Maternal Grandmother    Cancer Paternal Grandmother    Hypertension Son    Breast cancer Neg Hx     Past Surgical History:  Procedure Laterality Date   aortogram  05/07/2008, 06/22/2005   CESAREAN SECTION  1976  and 1979   COLONOSCOPY WITH PROPOFOL N/A 09/25/2014   Procedure: COLONOSCOPY WITH PROPOFOL;  Surgeon: Garlan Fair, MD;  Location: [facility]  ENDOSCOPY;  Service: Endoscopy;  Laterality: N/A;   PARTIAL HYSTERECTOMY     stenting of right common iliac artery   05/07/2008   Social History   Occupational History   Not on file  Tobacco Use   Smoking status: Former    Packs/day: 0.25    Years: 20.00    Additional pack years: 0.00    Total pack years: 5.00    Types: Cigarettes    Quit date: 11/26/1996    Years since quitting: 25.7   Smokeless tobacco: Never  Vaping Use   Vaping Use: Never used  Substance and Sexual Activity   Alcohol use: Not Currently   Drug use: No   Sexual activity: Not on file

## 2022-08-11 DIAGNOSIS — E1136 Type 2 diabetes mellitus with diabetic cataract: Secondary | ICD-10-CM | POA: Diagnosis not present

## 2022-08-11 DIAGNOSIS — E1165 Type 2 diabetes mellitus with hyperglycemia: Secondary | ICD-10-CM | POA: Diagnosis not present

## 2022-08-11 DIAGNOSIS — E1151 Type 2 diabetes mellitus with diabetic peripheral angiopathy without gangrene: Secondary | ICD-10-CM | POA: Diagnosis not present

## 2022-09-02 DIAGNOSIS — L298 Other pruritus: Secondary | ICD-10-CM | POA: Diagnosis not present

## 2022-12-07 ENCOUNTER — Ambulatory Visit
Admission: RE | Admit: 2022-12-07 | Discharge: 2022-12-07 | Disposition: A | Discharge status: Home / Self Care | Payer: Medicare PPO | Source: Ambulatory Visit | Attending: Internal Medicine | Admitting: Internal Medicine

## 2022-12-07 DIAGNOSIS — N958 Other specified menopausal and perimenopausal disorders: Secondary | ICD-10-CM | POA: Diagnosis not present

## 2022-12-07 DIAGNOSIS — M8588 Other specified disorders of bone density and structure, other site: Secondary | ICD-10-CM | POA: Diagnosis not present

## 2023-01-06 DIAGNOSIS — Z23 Encounter for immunization: Secondary | ICD-10-CM | POA: Diagnosis not present

## 2023-01-06 DIAGNOSIS — E1151 Type 2 diabetes mellitus with diabetic peripheral angiopathy without gangrene: Secondary | ICD-10-CM | POA: Diagnosis not present

## 2023-01-06 DIAGNOSIS — E1165 Type 2 diabetes mellitus with hyperglycemia: Secondary | ICD-10-CM | POA: Diagnosis not present

## 2023-01-06 DIAGNOSIS — F3341 Major depressive disorder, recurrent, in partial remission: Secondary | ICD-10-CM | POA: Diagnosis not present

## 2023-01-06 DIAGNOSIS — Z0000 Encounter for general adult medical examination without abnormal findings: Secondary | ICD-10-CM | POA: Diagnosis not present

## 2023-01-06 DIAGNOSIS — E785 Hyperlipidemia, unspecified: Secondary | ICD-10-CM | POA: Diagnosis not present

## 2023-01-06 DIAGNOSIS — Z9181 History of falling: Secondary | ICD-10-CM | POA: Diagnosis not present

## 2023-01-06 DIAGNOSIS — I10 Essential (primary) hypertension: Secondary | ICD-10-CM | POA: Diagnosis not present

## 2023-01-06 DIAGNOSIS — Z1331 Encounter for screening for depression: Secondary | ICD-10-CM | POA: Diagnosis not present

## 2023-01-06 DIAGNOSIS — I739 Peripheral vascular disease, unspecified: Secondary | ICD-10-CM | POA: Diagnosis not present

## 2023-01-06 DIAGNOSIS — E1136 Type 2 diabetes mellitus with diabetic cataract: Secondary | ICD-10-CM | POA: Diagnosis not present

## 2023-01-08 ENCOUNTER — Other Ambulatory Visit: Payer: Self-pay

## 2023-01-08 ENCOUNTER — Ambulatory Visit: Payer: Medicare PPO | Admitting: Surgical

## 2023-01-08 ENCOUNTER — Other Ambulatory Visit (INDEPENDENT_AMBULATORY_CARE_PROVIDER_SITE_OTHER): Payer: Medicare PPO

## 2023-01-08 DIAGNOSIS — M25572 Pain in left ankle and joints of left foot: Secondary | ICD-10-CM

## 2023-01-09 ENCOUNTER — Encounter: Payer: Self-pay | Admitting: Surgical

## 2023-01-09 NOTE — Progress Notes (Signed)
Office Visit Note   Patient: Heather Hart           Date of Birth: [birth year]           MRN: [id number] Visit Date: 01/08/2023 Requested by: Lorenda Ishihara, MD [address] [place],  [place] [address] PCP: Lorenda Ishihara, MD  Subjective: Chief Complaint  Patient presents with   Left Foot - Pain    HPI: Heather Hart is a 72 y.o. female who presents to the office reporting left ankle pain.  Patient has history of left ankle fracture in late 2023 that was treated nonoperatively due to her history of vascular disease.  She reports she has intermittent soreness in the ankle that really does not cause any significant distress aside from soreness every other day or so.  Never limits her activity.  No mechanical symptoms in the ankle.  Ankle does not give out on her.  She is ambulating in regular shoes without any ankle brace.  No other joint complaints.              ROS: All systems reviewed are negative as they relate to the chief complaint within the history of present illness.  Patient denies fevers or chills.  Assessment & Plan: Visit Diagnoses:  1. Pain in left ankle and joints of left foot     Plan: Patient is a 72 year old female who presents for evaluation of left ankle pain.  Has soreness every other day or so but just wanted to come in and have her ankle checked with her history of prior ankle fracture.  She has radiographs taken today demonstrating significant healing of the lateral malleolus fracture compared with prior radiographs from earlier this year.  She has really no tenderness over the fracture site.  Little bit of tenderness over the ankle joint line and some increased clunking sensation and minimally increased laxity of the ankle joint when testing the anterior drawer compared with the contralateral side.  This is likely just a result of the minimal displacement at the fracture site.  However, her ankle joint is very functional  for her and she has very little pain so plan to hold off on any further intervention.  She will follow-up with the office as needed.  If she has significant flareup in her ankle pain, she should return and we could consider further radiographs or ankle joint injection.  Follow-Up Instructions: No follow-ups on file.   Orders:  Orders Placed This Encounter  Procedures   XR Ankle Complete Left   No orders of the defined types were placed in this encounter.     Procedures: No procedures performed   Clinical Data: No additional findings.  Objective: Vital Signs: There were no vitals taken for this visit.  Physical Exam:  Constitutional: Patient appears well-developed HEENT:  Head: Normocephalic Eyes:EOM are normal Neck: Normal range of motion Cardiovascular: Normal rate Pulmonary/chest: Effort normal Neurologic: Patient is alert Skin: Skin is warm Psychiatric: Patient has normal mood and affect  Ortho Exam: Ortho exam demonstrates left foot and ankle with intact ankle joint dorsiflexion, plantarflexion, inversion, eversion.  No palpable DP or PT pulse which is consistent with prior exams.  No calf tenderness.  Negative Homans' sign.  She has really no tenderness at the former fracture site.  Very minimal tenderness over the ankle joint line.  Stable to stressing the syndesmosis.  Stable to anterior drawer test though this does reproduce a slight, subtle clunking sensation that is  not palpable when performing anterior drawer on the contralateral ankle.  Specialty Comments:  No specialty comments available.  Imaging: No results found.   PMFS History: Patient Active Problem List   Diagnosis Date Noted   Essential hypertension 11/16/2014   Hyperlipemia 11/16/2014   Diabetes mellitus ([facility]) 11/16/2014   PAD (peripheral artery disease) ([facility]) 05/24/2013   Aftercare following surgery of the circulatory system, NEC 05/26/2012   Peripheral vascular disease, unspecified ([facility])  05/26/2012   Past Medical History:  Diagnosis Date   Diabetes mellitus    Diet and exercise   Hyperlipidemia    Hypertension    Peripheral arterial disease ([facility])    Peripheral vascular disease ([facility])     Family History  Problem Relation Age of Onset   Heart attack Brother    Cancer Brother    Hypertension Brother    Heart disease Brother        Heart Disease before age 57   Hypertension Mother    Stroke Mother    Hypertension Father    Heart disease Father        Heart Disease before age 71   Heart attack Father    Cancer Sister    Stroke Maternal Grandmother    Cancer Paternal Grandmother    Hypertension Son    Breast cancer Neg Hx     Past Surgical History:  Procedure Laterality Date   aortogram  05/07/2008, 06/22/2005   CESAREAN SECTION  1976  and 1979   COLONOSCOPY WITH PROPOFOL N/A 09/25/2014   Procedure: COLONOSCOPY WITH PROPOFOL;  Surgeon: Charolett Bumpers, MD;  Location: [facility] ENDOSCOPY;  Service: Endoscopy;  Laterality: N/A;   PARTIAL HYSTERECTOMY     stenting of right common iliac artery   05/07/2008   Social History   Occupational History   Not on file  Tobacco Use   Smoking status: Former    Current packs/day: 0.00    Average packs/day: 0.3 packs/day for 20.0 years (5.0 ttl pk-yrs)    Types: Cigarettes    Start date: 11/26/1976    Quit date: 11/26/1996    Years since quitting: 26.1   Smokeless tobacco: Never  Vaping Use   Vaping status: Never Used  Substance and Sexual Activity   Alcohol use: Not Currently   Drug use: No   Sexual activity: Not on file

## 2023-01-28 ENCOUNTER — Ambulatory Visit (HOSPITAL_COMMUNITY): Payer: Medicare PPO | Attending: Vascular Surgery

## 2023-01-28 ENCOUNTER — Ambulatory Visit: Payer: Medicare PPO | Admitting: Vascular Surgery

## 2023-05-03 ENCOUNTER — Ambulatory Visit
Admission: RE | Admit: 2023-05-03 | Discharge: 2023-05-03 | Disposition: A | Discharge status: Home / Self Care | Payer: Medicare PPO | Source: Ambulatory Visit | Attending: Internal Medicine | Admitting: Internal Medicine

## 2023-05-03 ENCOUNTER — Other Ambulatory Visit: Payer: Self-pay | Admitting: Internal Medicine

## 2023-05-03 DIAGNOSIS — Z1231 Encounter for screening mammogram for malignant neoplasm of breast: Secondary | ICD-10-CM

## 2023-06-17 ENCOUNTER — Telehealth: Payer: Medicare PPO | Admitting: Physician Assistant

## 2023-06-17 DIAGNOSIS — R3989 Other symptoms and signs involving the genitourinary system: Secondary | ICD-10-CM

## 2023-06-17 MED ORDER — CEPHALEXIN 500 MG PO CAPS: 500.0000 mg | ORAL_CAPSULE | Freq: Two times a day (BID) | ORAL | 0 refills | Status: AC

## 2023-06-17 NOTE — Patient Instructions (Signed)
Heather Hart, thank you for joining [name], PA-C for today's virtual visit.  While this provider is not your primary care provider (PCP), if your PCP is located in our provider database this encounter information will be shared with them immediately following your visit.   A [facility] MyChart account gives you access to today's visit and all your visits, tests, and labs performed at [facility] " click here if you don't have a [facility] MyChart account or go to [email or web address]  Consent: (Patient) Heather Hart provided verbal consent for this virtual visit at the beginning of the encounter.  Current Medications:  Current Outpatient Medications:    ALPRAZolam (XANAX) 1 MG tablet, Take 1 mg by mouth at bedtime as needed., Disp: , Rfl:    aspirin EC 81 MG tablet, Take 81 mg by mouth daily., Disp: , Rfl:    benazepril (LOTENSIN) 40 MG tablet, Take 40 mg by mouth daily., Disp: , Rfl:    Calcium Carbonate-Vitamin D (CALCIUM + D) 600-200 MG-UNIT per tablet, Take 1 tablet by mouth 2 (two) times daily., Disp: , Rfl:    glimepiride (AMARYL) 4 MG tablet, , Disp: , Rfl: 0   hydrALAZINE (APRESOLINE) 25 MG tablet, Take 25 mg by mouth in the morning and at bedtime., Disp: , Rfl:    hydrochlorothiazide (HYDRODIURIL) 25 MG tablet, Take 25 mg by mouth daily., Disp: , Rfl:    HYDROcodone-acetaminophen (NORCO/VICODIN) 5-325 MG tablet, Take 1 tablet by mouth every 4 (four) hours as needed., Disp: 10 tablet, Rfl: 0   metFORMIN (GLUCOPHAGE-XR) 500 MG 24 hr tablet, Take 500 mg by mouth daily. Pt takes 4 tablets in the evening, Disp: , Rfl:    naproxen (NAPROSYN) 500 MG tablet, Take 1 tablet (500 mg total) by mouth 2 (two) times daily., Disp: 30 tablet, Rfl: 0   NIFEdipine (PROCARDIA XL/NIFEDICAL-XL) 90 MG 24 hr tablet, Take 1 tablet (90 mg total) by mouth daily., Disp: 90 tablet, Rfl: 3   ONETOUCH ULTRA test strip, daily. as directed, Disp: , Rfl:    OZEMPIC,  0.25 OR 0.5 MG/DOSE, 2 MG/1.5ML SOPN, [place] AS DIRECTED ONCE WEEKLY, Disp: , Rfl:    rosuvastatin (CRESTOR) 40 MG tablet, Take 40 mg by mouth daily., Disp: , Rfl:    Medications ordered in this encounter:  No orders of the defined types were placed in this encounter.    *If you need refills on other medications prior to your next appointment, please contact your pharmacy*  Follow-Up: Call back or seek an in-person evaluation if the symptoms worsen or if the condition fails to improve as anticipated.  [facility] [phone]  Other Instructions Your symptoms are consistent with a bladder infection, also called acute cystitis. Please take your antibiotic (Keflex) as directed until all pills are gone.  Stay very well hydrated.  Consider a daily probiotic (Align, Culturelle, or Activia) to help prevent stomach upset caused by the antibiotic.  Taking a probiotic daily may also help prevent recurrent UTIs.  Also consider taking AZO (Phenazopyridine) tablets to help decrease pain with urination.   If symptoms are not resolving with treatment or no substantial improvement within 48 hours or any worsening symptoms, you will have to be evaluated in person ASAP.  Urinary Tract Infection A urinary tract infection (UTI) can occur any place along the urinary tract. The tract includes the kidneys, ureters, bladder, and urethra. A type of germ called bacteria often causes a UTI. UTIs are often  helped with antibiotic medicine.  HOME CARE  If given, take antibiotics as told by your doctor. Finish them even if you start to feel better. Drink enough fluids to keep your pee (urine) clear or pale yellow. Avoid tea, drinks with caffeine, and bubbly (carbonated) drinks. Pee often. Avoid holding your pee in for a long time. Pee before and after having sex (intercourse). Wipe from front to back after you poop (bowel movement) if you are a woman. Use each tissue only once. GET HELP RIGHT AWAY IF:   You have back pain. You have lower belly (abdominal) pain. You have chills. You feel sick to your stomach (nauseous). You throw up (vomit). Your burning or discomfort with peeing does not go away. You have a fever. Your symptoms are not better in 3 days. MAKE SURE YOU:  Understand these instructions. Will watch your condition. Will get help right away if you are not doing well or get worse. Document Released: 10/28/2007 Document Revised: 02/03/2012 Document Reviewed: 12/10/2011 [facility] Patient Information 2015 [place], [place]. This information is not intended to replace advice given to you by your health care provider. Make sure you discuss any questions you have with your health care provider.    If you have been instructed to have an in-person evaluation today at a local Urgent Care facility, please use the link below. It will take you to a list of all of our available [facility], including address, phone number and hours of operation. Please do not delay care.  [facility]  If you or a family member do not have a primary care provider, use the link below to schedule a visit and establish care. When you choose a [facility] primary care physician or advanced practice provider, you gain a long-term partner in health. Find a Primary Care Provider  Learn more about [facility]'s in-office and virtual care options: [facility] - Get Care Now

## 2023-06-17 NOTE — Progress Notes (Signed)
Virtual Visit Consent   Heather Hart, you are scheduled for a virtual visit with a [facility] provider today. Just as with appointments in the office, your consent must be obtained to participate. Your consent will be active for this visit and any virtual visit you may have with one of our providers in the next 365 days. If you have a MyChart account, a copy of this consent can be sent to you electronically.  As this is a virtual visit, video technology does not allow for your provider to perform a traditional examination. This may limit your provider's ability to fully assess your condition. If your provider identifies any concerns that need to be evaluated in person or the need to arrange testing (such as labs, EKG, etc.), we will make arrangements to do so. Although advances in technology are sophisticated, we cannot ensure that it will always work on either your end or our end. If the connection with a video visit is poor, the visit may have to be switched to a telephone visit. With either a video or telephone visit, we are not always able to ensure that we have a secure connection.  By engaging in this virtual visit, you consent to the provision of healthcare and authorize for your insurance to be billed (if applicable) for the services provided during this visit. Depending on your insurance coverage, you may receive a charge related to this service.  I need to obtain your verbal consent now. Are you willing to proceed with your visit today? [name] has provided verbal consent on 06/17/2023 for a virtual visit (video or telephone). Heather Hart, [place]  Date: 06/17/2023 8:12 AM  Virtual Visit via Video Note   I, Heather Hart, connected with  [name]  ([id number], 72-30-1952) on 06/17/23 at  8:30 AM EST by a video-enabled telemedicine application and verified that I am speaking with the correct person using two identifiers.  Location: Patient: Virtual  Visit Location Patient: Home Provider: Virtual Visit Location Provider: Home Office   I discussed the limitations of evaluation and management by telemedicine and the availability of in person appointments. The patient expressed understanding and agreed to proceed.    History of Present Illness: Heather Hart is a 73 y.o. who identifies as a female who was assigned female at birth, and is being seen today for 2 days of urinary frequency and dysuria with faint pink-tinge to urine. Denies fever, chills, nausea or vomiting.  Denies severe back or belly pain. History of UTI but not frequent and not for quite some time.    HPI: HPI  Problems:  Patient Active Problem List   Diagnosis Date Noted   Essential hypertension 11/16/2014   Hyperlipemia 11/16/2014   Diabetes mellitus (HCC) 11/16/2014   PAD (peripheral artery disease) (HCC) 05/24/2013   Aftercare following surgery of the circulatory system, NEC 05/26/2012   Peripheral vascular disease, unspecified (HCC) 05/26/2012    Allergies:  Allergies  Allergen Reactions   Nitrofurantoin Hives   Medications:  Current Outpatient Medications:    cephALEXin (KEFLEX) 500 MG capsule, Take 1 capsule (500 mg total) by mouth 2 (two) times daily for 7 days., Disp: 14 capsule, Rfl: 0   ALPRAZolam (XANAX) 1 MG tablet, Take 1 mg by mouth at bedtime as needed., Disp: , Rfl:    aspirin EC 81 MG tablet, Take 81 mg by mouth daily., Disp: , Rfl:    benazepril (LOTENSIN) 40 MG tablet, Take 40 mg by  mouth daily., Disp: , Rfl:    Calcium Carbonate-Vitamin D (CALCIUM + D) 600-200 MG-UNIT per tablet, Take 1 tablet by mouth 2 (two) times daily., Disp: , Rfl:    glimepiride (AMARYL) 4 MG tablet, , Disp: , Rfl: 0   hydrALAZINE (APRESOLINE) 25 MG tablet, Take 25 mg by mouth in the morning and at bedtime., Disp: , Rfl:    hydrochlorothiazide (HYDRODIURIL) 25 MG tablet, Take 25 mg by mouth daily., Disp: , Rfl:    HYDROcodone-acetaminophen (NORCO/VICODIN) 5-325 MG  tablet, Take 1 tablet by mouth every 4 (four) hours as needed., Disp: 10 tablet, Rfl: 0   metFORMIN (GLUCOPHAGE-XR) 500 MG 24 hr tablet, Take 500 mg by mouth daily. Pt takes 4 tablets in the evening, Disp: , Rfl:    naproxen (NAPROSYN) 500 MG tablet, Take 1 tablet (500 mg total) by mouth 2 (two) times daily., Disp: 30 tablet, Rfl: 0   NIFEdipine (PROCARDIA XL/NIFEDICAL-XL) 90 MG 24 hr tablet, Take 1 tablet (90 mg total) by mouth daily., Disp: 90 tablet, Rfl: 3   ONETOUCH ULTRA test strip, daily. as directed, Disp: , Rfl:    OZEMPIC, 0.25 OR 0.5 MG/DOSE, 2 MG/1.5ML SOPN, [place] AS DIRECTED ONCE WEEKLY, Disp: , Rfl:    rosuvastatin (CRESTOR) 40 MG tablet, Take 40 mg by mouth daily., Disp: , Rfl:   Observations/Objective: Patient is well-developed, well-nourished in no acute distress.  Resting comfortably at home.  Head is normocephalic, atraumatic.  No labored breathing. Speech is clear and coherent with logical content.  Patient is alert and oriented at baseline.   Assessment and Plan: 1. Suspected UTI (Primary) - cephALEXin (KEFLEX) 500 MG capsule; Take 1 capsule (500 mg total) by mouth 2 (two) times daily for 7 days.  Dispense: 14 capsule; Refill: 0  Classic UTI symptoms with absence of alarm signs or symptoms. Will treat empirically with Keflex for suspected uncomplicated cystitis. Supportive measures and OTC medications reviewed. Strict in-person evaluation precautions discussed.    Follow Up Instructions: I discussed the assessment and treatment plan with the patient. The patient was provided an opportunity to ask questions and all were answered. The patient agreed with the plan and demonstrated an understanding of the instructions.  A copy of instructions were sent to the patient via MyChart unless otherwise noted below.   The patient was advised to call back or seek an in-person evaluation if the symptoms worsen or if the condition fails to improve as anticipated.    Heather Climes, PA-C

## 2023-06-30 DIAGNOSIS — I10 Essential (primary) hypertension: Secondary | ICD-10-CM | POA: Diagnosis not present

## 2023-06-30 DIAGNOSIS — E1151 Type 2 diabetes mellitus with diabetic peripheral angiopathy without gangrene: Secondary | ICD-10-CM | POA: Diagnosis not present

## 2023-06-30 DIAGNOSIS — E119 Type 2 diabetes mellitus without complications: Secondary | ICD-10-CM | POA: Diagnosis not present

## 2023-06-30 DIAGNOSIS — N3090 Cystitis, unspecified without hematuria: Secondary | ICD-10-CM | POA: Diagnosis not present

## 2023-07-14 DIAGNOSIS — I739 Peripheral vascular disease, unspecified: Secondary | ICD-10-CM | POA: Diagnosis not present

## 2023-07-14 DIAGNOSIS — I10 Essential (primary) hypertension: Secondary | ICD-10-CM | POA: Diagnosis not present

## 2023-07-14 DIAGNOSIS — E785 Hyperlipidemia, unspecified: Secondary | ICD-10-CM | POA: Diagnosis not present

## 2023-07-14 DIAGNOSIS — E1151 Type 2 diabetes mellitus with diabetic peripheral angiopathy without gangrene: Secondary | ICD-10-CM | POA: Diagnosis not present

## 2023-08-05 NOTE — Progress Notes (Unsigned)
 Patient ID: Heather Hart, female   DOB: 1950/12/20, 73 y.o.   MRN: [id number]  Reason for Consult: No chief complaint on file.   Referred by Lorenda Ishihara,*  Subjective:     HPI  Heather Hart is a 73 y.o. female with a history of PAD and a known aortic occlusion.  She was last seen about a year ago and was noted to have an ABI of 0.5 with toe pressures greater than 60 bilaterally.  She denied claudication, rest pain or ulceration although she did have a nonunion of a fibula fracture which was slow healing, no open wounds.  She reports  Past Medical History:  Diagnosis Date   Diabetes mellitus    Diet and exercise   Hyperlipidemia    Hypertension    Peripheral arterial disease (HCC)    Peripheral vascular disease (HCC)    Family History  Problem Relation Age of Onset   Heart attack Brother    Cancer Brother    Hypertension Brother    Heart disease Brother        Heart Disease before age 110   Hypertension Mother    Stroke Mother    Hypertension Father    Heart disease Father        Heart Disease before age 39   Heart attack Father    Cancer Sister    Stroke Maternal Grandmother    Cancer Paternal Grandmother    Hypertension Son    Breast cancer Neg Hx    Past Surgical History:  Procedure Laterality Date   aortogram  05/07/2008, 06/22/2005   CESAREAN SECTION  1976  and 1979   COLONOSCOPY WITH PROPOFOL N/A 09/25/2014   Procedure: COLONOSCOPY WITH PROPOFOL;  Surgeon: Charolett Bumpers, MD;  Location: [facility];  Service: Endoscopy;  Laterality: N/A;   PARTIAL HYSTERECTOMY     stenting of right common iliac artery   05/07/2008    Short Social History:  Social History   Tobacco Use   Smoking status: Former    Current packs/day: 0.00    Average packs/day: 0.3 packs/day for 20.0 years (5.0 ttl pk-yrs)    Types: Cigarettes    Start date: 11/26/1976    Quit date: 11/26/1996    Years since quitting: 26.7   Smokeless tobacco: Never  Substance  Use Topics   Alcohol use: Not Currently    Allergies  Allergen Reactions   Nitrofurantoin Hives    Current Outpatient Medications  Medication Sig Dispense Refill   ALPRAZolam (XANAX) 1 MG tablet Take 1 mg by mouth at bedtime as needed.     aspirin EC 81 MG tablet Take 81 mg by mouth daily.     benazepril (LOTENSIN) 40 MG tablet Take 40 mg by mouth daily.     Calcium Carbonate-Vitamin D (CALCIUM + D) 600-200 MG-UNIT per tablet Take 1 tablet by mouth 2 (two) times daily.     glimepiride (AMARYL) 4 MG tablet   0   hydrALAZINE (APRESOLINE) 25 MG tablet Take 25 mg by mouth in the morning and at bedtime.     hydrochlorothiazide (HYDRODIURIL) 25 MG tablet Take 25 mg by mouth daily.     HYDROcodone-acetaminophen (NORCO/VICODIN) 5-325 MG tablet Take 1 tablet by mouth every 4 (four) hours as needed. 10 tablet 0   metFORMIN (GLUCOPHAGE-XR) 500 MG 24 hr tablet Take 500 mg by mouth daily. Pt takes 4 tablets in the evening     naproxen (NAPROSYN) 500 MG tablet Take 1 tablet (  500 mg total) by mouth 2 (two) times daily. 30 tablet 0   NIFEdipine (PROCARDIA XL/NIFEDICAL-XL) 90 MG 24 hr tablet Take 1 tablet (90 mg total) by mouth daily. 90 tablet 3   ONETOUCH ULTRA test strip daily. as directed     OZEMPIC, 0.25 OR 0.5 MG/DOSE, 2 MG/1.5ML SOPN [place] AS DIRECTED ONCE WEEKLY     rosuvastatin (CRESTOR) 40 MG tablet Take 40 mg by mouth daily.     No current facility-administered medications for this visit.    REVIEW OF SYSTEMS  Positive for ***  All other systems were reviewed and are negative     Objective:  Objective   There were no vitals filed for this visit. There is no height or weight on file to calculate BMI.  Physical Exam General: no acute distress Cardiac: hemodynamically stable Pulm: normal work of breathing Abdomen: non-tender, no pulsatile mass*** Neuro: alert, no focal deficit Extremities: no edema, cyanosis or wounds*** Vascular:   Right: ***  Left:  ***   Data: ABI ***  CTA independently reviewed Occlusion of the distal infrarenal aorta, occlusion of the right common iliac stent and left common iliac artery with reconstitution of the external iliacs by the internal iliacs bilaterally. Common femorals, SFA, popliteal's and runoff are widely patent bilaterally     Assessment/Plan:     Heather Hart is a 73 y.o. female with asymptomatic PAD and known aortic occlusion. ***    Recommendations to optimize cardiovascular risk: Abstinence from all tobacco products. Blood glucose control with goal A1c < 7%. Blood pressure control with goal blood pressure < 140/90 mmHg. Lipid reduction therapy with goal LDL-C <100 mg/dL  Aspirin 81mg  PO QD.  Atorvastatin 40-80mg  PO QD (or other "high intensity" statin therapy).     Daria Pastures MD [facility]

## 2023-08-06 ENCOUNTER — Ambulatory Visit: Payer: Medicare PPO | Admitting: Vascular Surgery

## 2023-08-06 ENCOUNTER — Encounter: Payer: Self-pay | Admitting: Vascular Surgery

## 2023-08-06 ENCOUNTER — Ambulatory Visit (HOSPITAL_COMMUNITY)
Admission: RE | Admit: 2023-08-06 | Discharge: 2023-08-06 | Disposition: A | Discharge status: Home / Self Care | Payer: Medicare PPO | Source: Ambulatory Visit | Attending: Vascular Surgery | Admitting: Vascular Surgery

## 2023-08-06 VITALS — BP 128/72 | HR 62 | Temp 97.9°F | Resp 18 | Ht 61.0 in | Wt 137.3 lb

## 2023-08-06 DIAGNOSIS — I739 Peripheral vascular disease, unspecified: Secondary | ICD-10-CM | POA: Insufficient documentation

## 2023-08-06 LAB — VAS US ABI WITH/WO TBI
Left ABI: 0.49
Right ABI: 0.49

## 2023-11-16 ENCOUNTER — Encounter (HOSPITAL_COMMUNITY): Payer: Self-pay | Admitting: Internal Medicine

## 2023-11-16 ENCOUNTER — Observation Stay (HOSPITAL_BASED_OUTPATIENT_CLINIC_OR_DEPARTMENT_OTHER)
Admission: EM | Admit: 2023-11-16 | Discharge: 2023-11-17 | Disposition: A | Discharge status: Home / Self Care | Attending: Internal Medicine | Admitting: Internal Medicine

## 2023-11-16 ENCOUNTER — Other Ambulatory Visit: Payer: Self-pay

## 2023-11-16 DIAGNOSIS — T783XXA Angioneurotic edema, initial encounter: Secondary | ICD-10-CM | POA: Diagnosis not present

## 2023-11-16 DIAGNOSIS — E119 Type 2 diabetes mellitus without complications: Secondary | ICD-10-CM | POA: Diagnosis not present

## 2023-11-16 DIAGNOSIS — I10 Essential (primary) hypertension: Secondary | ICD-10-CM | POA: Diagnosis not present

## 2023-11-16 DIAGNOSIS — E876 Hypokalemia: Secondary | ICD-10-CM | POA: Insufficient documentation

## 2023-11-16 DIAGNOSIS — R220 Localized swelling, mass and lump, head: Secondary | ICD-10-CM | POA: Diagnosis present

## 2023-11-16 DIAGNOSIS — Z7982 Long term (current) use of aspirin: Secondary | ICD-10-CM | POA: Diagnosis not present

## 2023-11-16 DIAGNOSIS — I739 Peripheral vascular disease, unspecified: Secondary | ICD-10-CM | POA: Diagnosis not present

## 2023-11-16 DIAGNOSIS — F419 Anxiety disorder, unspecified: Secondary | ICD-10-CM | POA: Insufficient documentation

## 2023-11-16 DIAGNOSIS — E785 Hyperlipidemia, unspecified: Secondary | ICD-10-CM | POA: Insufficient documentation

## 2023-11-16 LAB — CBC WITH DIFFERENTIAL/PLATELET
Abs Immature Granulocytes: 0.03 10*3/uL (ref 0.00–0.07)
Basophils Absolute: 0 10*3/uL (ref 0.0–0.1)
Basophils Relative: 0 %
Eosinophils Absolute: 0.2 10*3/uL (ref 0.0–0.5)
Eosinophils Relative: 1 %
HCT: 41.1 % (ref 36.0–46.0)
Hemoglobin: 13.3 g/dL (ref 12.0–15.0)
Immature Granulocytes: 0 %
Lymphocytes Relative: 38 %
Lymphs Abs: 5.1 10*3/uL — ABNORMAL HIGH (ref 0.7–4.0)
MCH: 23.8 pg — ABNORMAL LOW (ref 26.0–34.0)
MCHC: 32.4 g/dL (ref 30.0–36.0)
MCV: 73.5 fL — ABNORMAL LOW (ref 80.0–100.0)
Monocytes Absolute: 1 10*3/uL (ref 0.1–1.0)
Monocytes Relative: 8 %
Neutro Abs: 7.3 10*3/uL (ref 1.7–7.7)
Neutrophils Relative %: 53 %
Platelets: 258 10*3/uL (ref 150–400)
RBC: 5.59 MIL/uL — ABNORMAL HIGH (ref 3.87–5.11)
RDW: 17.3 % — ABNORMAL HIGH (ref 11.5–15.5)
WBC: 13.7 10*3/uL — ABNORMAL HIGH (ref 4.0–10.5)
nRBC: 0 % (ref 0.0–0.2)

## 2023-11-16 LAB — BASIC METABOLIC PANEL WITH GFR
Anion gap: 14 (ref 5–15)
BUN: 28 mg/dL — ABNORMAL HIGH (ref 8–23)
CO2: 24 mmol/L (ref 22–32)
Calcium: 10 mg/dL (ref 8.9–10.3)
Chloride: 102 mmol/L (ref 98–111)
Creatinine, Ser: 1.14 mg/dL — ABNORMAL HIGH (ref 0.44–1.00)
GFR, Estimated: 51 mL/min — ABNORMAL LOW (ref >60)
Glucose, Bld: 109 mg/dL — ABNORMAL HIGH (ref 70–99)
Potassium: 3.3 mmol/L — ABNORMAL LOW (ref 3.5–5.1)
Sodium: 140 mmol/L (ref 135–145)

## 2023-11-16 LAB — GLUCOSE, CAPILLARY: Glucose-Capillary: 149 mg/dL — ABNORMAL HIGH (ref 70–99)

## 2023-11-16 MED ORDER — HYDRALAZINE HCL 20 MG/ML IJ SOLN: 10.0000 mg | Freq: Four times a day (QID) | INTRAMUSCULAR | Status: DC | PRN

## 2023-11-16 MED ORDER — FAMOTIDINE IN NACL 20-0.9 MG/50ML-% IV SOLN
20.0000 mg | Freq: Once | INTRAVENOUS | Status: AC
  Administered 2023-11-16: 20 mg via INTRAVENOUS
  Filled 2023-11-16: qty 50

## 2023-11-16 MED ORDER — INSULIN ASPART 100 UNIT/ML IJ SOLN: 0.0000 [IU] | Freq: Three times a day (TID) | INTRAMUSCULAR | Status: DC

## 2023-11-16 MED ORDER — ALBUTEROL SULFATE (2.5 MG/3ML) 0.083% IN NEBU: 2.5000 mg | INHALATION_SOLUTION | Freq: Four times a day (QID) | RESPIRATORY_TRACT | Status: DC | PRN

## 2023-11-16 MED ORDER — BISACODYL 5 MG PO TBEC: 5.0000 mg | DELAYED_RELEASE_TABLET | Freq: Every day | ORAL | Status: DC | PRN

## 2023-11-16 MED ORDER — METHYLPREDNISOLONE SODIUM SUCC 40 MG IJ SOLR
40.0000 mg | Freq: Every day | INTRAMUSCULAR | Status: DC
  Administered 2023-11-17: 40 mg via INTRAVENOUS
  Filled 2023-11-16: qty 1

## 2023-11-16 MED ORDER — ALPRAZOLAM 0.5 MG PO TABS
1.0000 mg | ORAL_TABLET | Freq: Every evening | ORAL | Status: DC | PRN
  Administered 2023-11-16: 1 mg via ORAL
  Filled 2023-11-16: qty 4

## 2023-11-16 MED ORDER — ENOXAPARIN SODIUM 40 MG/0.4ML IJ SOSY
40.0000 mg | PREFILLED_SYRINGE | INTRAMUSCULAR | Status: DC
  Administered 2023-11-17: 40 mg via SUBCUTANEOUS
  Filled 2023-11-16: qty 0.4

## 2023-11-16 MED ORDER — ASPIRIN 81 MG PO TBEC
81.0000 mg | DELAYED_RELEASE_TABLET | Freq: Every day | ORAL | Status: DC
  Administered 2023-11-16 – 2023-11-17 (×2): 81 mg via ORAL
  Filled 2023-11-16 (×2): qty 1

## 2023-11-16 MED ORDER — ACETAMINOPHEN 650 MG RE SUPP: 650.0000 mg | Freq: Four times a day (QID) | RECTAL | Status: DC | PRN

## 2023-11-16 MED ORDER — NIFEDIPINE ER OSMOTIC RELEASE 60 MG PO TB24
90.0000 mg | ORAL_TABLET | Freq: Every day | ORAL | Status: DC
  Administered 2023-11-16 – 2023-11-17 (×2): 90 mg via ORAL
  Filled 2023-11-16 (×2): qty 1

## 2023-11-16 MED ORDER — POLYETHYLENE GLYCOL 3350 17 G PO PACK: 17.0000 g | PACK | Freq: Every day | ORAL | Status: DC | PRN

## 2023-11-16 MED ORDER — DIPHENHYDRAMINE HCL 50 MG/ML IJ SOLN: 25.0000 mg | Freq: Three times a day (TID) | INTRAMUSCULAR | Status: DC | PRN

## 2023-11-16 MED ORDER — DIPHENHYDRAMINE HCL 50 MG/ML IJ SOLN
25.0000 mg | Freq: Once | INTRAMUSCULAR | Status: AC
  Administered 2023-11-16: 25 mg via INTRAVENOUS
  Filled 2023-11-16: qty 1

## 2023-11-16 MED ORDER — ACETAMINOPHEN 325 MG PO TABS: 650.0000 mg | ORAL_TABLET | Freq: Four times a day (QID) | ORAL | Status: DC | PRN

## 2023-11-16 MED ORDER — INSULIN ASPART 100 UNIT/ML IJ SOLN: 0.0000 [IU] | Freq: Every day | INTRAMUSCULAR | Status: DC

## 2023-11-16 MED ORDER — METHYLPREDNISOLONE SODIUM SUCC 125 MG IJ SOLR
125.0000 mg | Freq: Once | INTRAMUSCULAR | Status: AC
  Administered 2023-11-16: 125 mg via INTRAVENOUS
  Filled 2023-11-16: qty 2

## 2023-11-16 MED ORDER — METHYLPREDNISOLONE SODIUM SUCC 40 MG IJ SOLR: 40.0000 mg | Freq: Every day | INTRAMUSCULAR | Status: DC

## 2023-11-16 NOTE — ED Notes (Signed)
 [facility] in ED preparing pt for transfer

## 2023-11-16 NOTE — ED Triage Notes (Addendum)
 Pt presents via POV c/o angioedema. Tongue visibly swollen on assessment. Pt speech altered due to tongue swelling.   Roselyn MD at bedside to evaluate.

## 2023-11-16 NOTE — Plan of Care (Signed)

## 2023-11-16 NOTE — ED Provider Notes (Signed)
 Patient being admitted for angioedema.  At 7:30 AM evaluated the patient.  She has persistent tongue swelling but no voice changes or inability to handle her own secretions.  Still having upper lip swelling and minimal swelling in her left lower lip which patient states is new.  No difficulty breathing and sats are 97% on room air.  Vital signs are stable at this time.  Spoke with the hospitalist and currently waiting for a bed.  Will continue to monitor patient.  She has received Benadryl, Pepcid and Solu-Medrol.  Epi was held at the time due to her history of PVD, age and cardiovascular risk factors epi was held.   Doretha Folks, MD 11/16/23 [phone]

## 2023-11-16 NOTE — Plan of Care (Signed)

## 2023-11-16 NOTE — ED Notes (Signed)
 No stridor noted at this time. Breathsounds CTAB. SpO2 99%.

## 2023-11-16 NOTE — H&P (Signed)
 [facility] History and Physical  Heather Hart FMW:[id number] DOB: Apr 27, 1951 DOA: 11/16/2023 PCP: Elliot Charm, MD  Presented from: Home Chief Complaint: Swelling of lips and tongue  History of Present Illness: Heather Hart is a 73 y.o. female with PMH significant for DM2 on Ozempic, HTN, HLD, PAD.  Around 1030 last night, patient noticed some raised rashes on her arms.  It was soon followed by feeling of numbness around her lips and tongue swelling.  She was having hard time articulating words.  Did not have trouble swallowing.  Symptoms did not subside with Benadryl in about 2 hours and felt that her tongue was swelling more and hence she presented to ED at [facility] last night. Of note, she is on benazepril for blood pressure.  She has tolerated it for several years.  Of note, she has allergy to Macrobid in the past but never had allergy to any ACE inhibitors  In the ED, she was afebrile, hemodynamically stable. Labs with WC count 13.7, BUN/creatinine 28/1.4, potassium low at 3.3 She was given IV Benadryl, IV Pepcid, IV Solu-Medrol. After about 6 hours in the ED, patient was still noted to have tongue swelling. Decision was made to transfer her to [facility] for observation under [facility].  I evaluated the patient this afternoon after she arrived to [facility]. At the time of my evaluation, patient was lying down in bed.  She still had some swelling of the lips and tongue.  Felt dysarthric. Rashes in the skin all resolved. Denies any similar rashes in the past.  Reports allergy to Macrobid.  No history of seasonal allergy.  Review of Systems:  All systems were reviewed and were negative unless otherwise mentioned in the HPI   Past medical history: Past Medical History:  Diagnosis Date   Diabetes mellitus    Diet and exercise   Hyperlipidemia    Hypertension    Peripheral arterial disease (HCC)    Peripheral vascular disease (HCC)      Past surgical history: Past Surgical History:  Procedure Laterality Date   aortogram  05/07/2008, 06/22/2005   CESAREAN SECTION  1976  and 1979   COLONOSCOPY WITH PROPOFOL  N/A 09/25/2014   Procedure: COLONOSCOPY WITH PROPOFOL ;  Surgeon: Gladis MARLA Louder, MD;  Location: [facility];  Service: Endoscopy;  Laterality: N/A;   PARTIAL HYSTERECTOMY     stenting of right common iliac artery   05/07/2008    Social History:  reports that she quit smoking about 26 years ago. Her smoking use included cigarettes. She started smoking about 47 years ago. She has a 5 pack-year smoking history. She has never used smokeless tobacco. She reports that she does not currently use alcohol. She reports that she does not use drugs.  Allergies:  Allergies  Allergen Reactions   Lisinopril Swelling   Nitrofurantoin Hives   Lisinopril and Nitrofurantoin   Family history:  Family History  Problem Relation Age of Onset   Heart attack Brother    Cancer Brother    Hypertension Brother    Heart disease Brother        Heart Disease before age 69   Hypertension Mother    Stroke Mother    Hypertension Father    Heart disease Father        Heart Disease before age 93   Heart attack Father    Cancer Sister    Stroke Maternal Grandmother    Cancer Paternal Grandmother    Hypertension Son  Breast cancer Neg Hx      Physical Exam: Vitals:   11/16/23 1037 11/16/23 1100 11/16/23 1200 11/16/23 1438  BP:  108/62 113/80 131/64  Pulse:  78 76 72  Resp:  16  17  Temp: 98.7 F (37.1 C)   98.4 F (36.9 C)  TempSrc:    Oral  SpO2:  98% 99% 99%  Weight:       Wt Readings from Last 3 Encounters:  11/16/23 63.5 kg  08/06/23 62.3 kg  07/30/22 65.8 kg   Body mass index is 26.45 kg/m.  General exam: Pleasant, elderly African-American female Skin: No rashes, lesions or ulcers. HEENT: Mild swelling around the lips noted.  Some dysarthria noted Lungs: Clear to auscultation bilaterally,  CVS: S1, S2,  no murmur,   GI/Abd: Soft, nontender, nondistended, bowel sound present,   CNS: Alert, awake, oriented x 3 Psychiatry: Mood appropriate,  Extremities: No pedal edema, no calf tenderness,    ----------------------------------------------------------------------------------------------------------------------------------------- ----------------------------------------------------------------------------------------------------------------------------------------- -----------------------------------------------------------------------------------------------------------------------------------------  Assessment/Plan: Principal Problem:   Angio-edema, initial encounter  Angioedema Started last night initially with raised rashes on her arms and later numbness and swelling of lips and tongue. Could be due to benazepril.  No other suspected etiology at this time. Given IV Solu-Medrol, IV Benadryl and IV Pepcid in the ED last night.  Continues to have some swelling.  I will continue these medicines today. Continue to monitor  Type 2 diabetes mellitus Last A1c 7 on 2024  on Ozempic weekly for several months  Hypertension PTA meds- benazepril, hydralazine 25 mg twice daily, HCTZ 25 mg daily, nifedipine  90 mg daily, Blood pressure in normal range.  Has not gotten her medicines today Stop benazepril.  Resume nifedipine  and hydralazine as needed.  PAD, HLD PTA meds- aspirin.  Not on a statin.  Anxiety Xanax as needed  Mobility: Encourage ambulation  Goals of care:   Code Status: Full Code    DVT prophylaxis:  enoxaparin (LOVENOX) injection 40 mg Start: 11/17/23 0800   Antimicrobials: None Fluid: None Consultants: None Family Communication: None at bedside  Status: Observation Level of care:  Med-Surg   Patient is from: Home Anticipated d/c to: Home hopefully tomorrow  Diet:  Diet Order             Diet regular Room service appropriate? Yes; Fluid consistency: Thin  Diet  effective now                    ------------------------------------------------------------------------------------- Severity of Illness: The appropriate patient status for this patient is OBSERVATION. Observation status is judged to be reasonable and necessary in order to provide the required intensity of service to ensure the patient's safety. The patient's presenting symptoms, physical exam findings, and initial radiographic and laboratory data in the context of their medical condition is felt to place them at decreased risk for further clinical deterioration. Furthermore, it is anticipated that the patient will be medically stable for discharge from the hospital within 2 midnights of admission.  -------------------------------------------------------------------------------------  Home Meds: Prior to Admission medications   Medication Sig Start Date End Date Taking? Authorizing Provider  ALPRAZolam (XANAX) 1 MG tablet Take 1 mg by mouth at bedtime as needed.   Yes [provider]  aspirin EC 81 MG tablet Take 81 mg by mouth daily.   Yes [provider]  benazepril (LOTENSIN) 40 MG tablet Take 40 mg by mouth daily. 03/21/19  Yes [provider]  Calcium Carbonate-Vitamin D  (CALCIUM + D) 600-200 MG-UNIT per tablet  Take 1 tablet by mouth 2 (two) times daily.   Yes [provider]  diphenhydrAMINE (SOMINEX) 25 MG tablet Take 50 mg by mouth as needed for sleep.   Yes [provider]  hydrALAZINE (APRESOLINE) 25 MG tablet Take 25 mg by mouth in the morning and at bedtime.   Yes [provider]  hydrochlorothiazide (HYDRODIURIL) 25 MG tablet Take 25 mg by mouth daily.   Yes [provider]  Multiple Vitamin (MULTIVITAMIN ADULT PO) Take 1 tablet by mouth daily.   Yes [provider]  NIFEdipine  (PROCARDIA  XL/NIFEDICAL-XL) 90 MG 24 hr tablet Take 1 tablet (90 mg total) by mouth daily. 09/01/19  Yes O'Neal, Darryle Ned, MD   HYDROcodone -acetaminophen  (NORCO/VICODIN) 5-325 MG tablet Take 1 tablet by mouth every 4 (four) hours as needed. Patient not taking: Reported on 11/16/2023 04/23/22   Dean Clarity, MD  naproxen  (NAPROSYN ) 500 MG tablet Take 1 tablet (500 mg total) by mouth 2 (two) times daily. Patient not taking: Reported on 11/16/2023 04/23/22   Dean Clarity, MD  [facility] ULTRA test strip daily. as directed 03/27/19   [provider]    Labs on Admission:   CBC: Recent Labs  Lab 11/16/23 0152  WBC 13.7*  NEUTROABS 7.3  HGB 13.3  HCT 41.1  MCV 73.5*  PLT 258    Basic Metabolic Panel: Recent Labs  Lab 11/16/23 0152  NA 140  K 3.3*  CL 102  CO2 24  GLUCOSE 109*  BUN 28*  CREATININE 1.14*  CALCIUM 10.0    Liver Function Tests: No results for input(s): AST, ALT, ALKPHOS, BILITOT, PROT, ALBUMIN in the last 168 hours. No results for input(s): LIPASE, AMYLASE in the last 168 hours. No results for input(s): AMMONIA in the last 168 hours.  Cardiac Enzymes: No results for input(s): CKTOTAL, CKMB, CKMBINDEX, TROPONINI in the last 168 hours.  BNP (last 3 results) No results for input(s): BNP in the last 8760 hours.  ProBNP (last 3 results) No results for input(s): PROBNP in the last 8760 hours.  CBG: No results for input(s): GLUCAP in the last 168 hours.  Lipase  No results found for: LIPASE   Urinalysis No results found for: COLORURINE, APPEARANCEUR, LABSPEC, PHURINE, GLUCOSEU, HGBUR, BILIRUBINUR, KETONESUR, PROTEINUR, UROBILINOGEN, NITRITE, LEUKOCYTESUR   Drugs of Abuse  No results found for: LABOPIA, COCAINSCRNUR, LABBENZ, AMPHETMU, THCU, LABBARB    Radiological Exams on Admission: No results found.   Signed, Chapman Rota, MD [facility] 11/16/2023

## 2023-11-16 NOTE — ED Provider Notes (Signed)
 [place] EMERGENCY DEPARTMENT AT [facility]  Provider Note  CSN: [id number] Arrival date & time: 11/16/23 0135  History Chief Complaint  Patient presents with   Angioedema    Heather Hart is a 73 y.o. female with history of HTN on benazepril among others reports she had some itchy spots on her arms and a mild swelling of left tongue a few hours ago. She took some benadryl and went to bed, woke up just prior to arrival with worsening tongue swelling. She denies any trouble breathing or swallowing.    Home Medications Prior to Admission medications   Medication Sig Start Date End Date Taking? Authorizing Provider  ALPRAZolam (XANAX) 1 MG tablet Take 1 mg by mouth at bedtime as needed.    [provider]  aspirin EC 81 MG tablet Take 81 mg by mouth daily.    [provider]  benazepril (LOTENSIN) 40 MG tablet Take 40 mg by mouth daily. 03/21/19   [provider]  Calcium Carbonate-Vitamin D  (CALCIUM + D) 600-200 MG-UNIT per tablet Take 1 tablet by mouth 2 (two) times daily.    [provider]  glimepiride (AMARYL) 4 MG tablet  04/29/16   [provider]  hydrALAZINE (APRESOLINE) 25 MG tablet Take 25 mg by mouth in the morning and at bedtime.    [provider]  hydrochlorothiazide (HYDRODIURIL) 25 MG tablet Take 25 mg by mouth daily.    [provider]  HYDROcodone -acetaminophen  (NORCO/VICODIN) 5-325 MG tablet Take 1 tablet by mouth every 4 (four) hours as needed. 04/23/22   Haviland, Julie, MD  metFORMIN (GLUCOPHAGE-XR) 500 MG 24 hr tablet Take 500 mg by mouth daily. Pt takes 4 tablets in the evening 11/21/19   [provider]  naproxen  (NAPROSYN ) 500 MG tablet Take 1 tablet (500 mg total) by mouth 2 (two) times daily. 04/23/22   Haviland, Julie, MD  NIFEdipine  (PROCARDIA  XL/NIFEDICAL-XL) 90 MG 24 hr tablet Take 1 tablet (90 mg total) by mouth daily. 09/01/19   O'Neal, Darryle Ned, MD  [facility] ULTRA  test strip daily. as directed 03/27/19   [provider]  OZEMPIC, 0.25 OR 0.5 MG/DOSE, 2 MG/1.5ML SOPN [place]  AS DIRECTED ONCE WEEKLY 03/22/19   [provider]  rosuvastatin (CRESTOR) 40 MG tablet Take 40 mg by mouth daily. 02/15/19   [provider]     Allergies    Lisinopril and Nitrofurantoin   Review of Systems   Review of Systems Please see HPI for pertinent positives and negatives  Physical Exam BP 109/60 (BP Location: Left Arm)   Pulse 72   Temp 98.9 F (37.2 C) (Oral)   Resp 18   Wt 63.5 kg   SpO2 95%   BMI 26.45 kg/m   Physical Exam Vitals and nursing note reviewed.  Constitutional:      Appearance: Normal appearance.  HENT:     Head: Normocephalic and atraumatic.     Nose: Nose normal.     Mouth/Throat:     Mouth: Mucous membranes are moist.     Comments: Angioedema of tongue, mostly anterior, L>R, lips are normal  Eyes:     Extraocular Movements: Extraocular movements intact.     Conjunctiva/sclera: Conjunctivae normal.    Cardiovascular:     Rate and Rhythm: Normal rate.  Pulmonary:     Effort: Pulmonary effort is normal.     Breath sounds: Normal breath sounds. No stridor. No wheezing.  Abdominal:     General: Abdomen is  flat.     Palpations: Abdomen is soft.     Tenderness: There is no abdominal tenderness.   Musculoskeletal:        General: No swelling. Normal range of motion.     Cervical back: Neck supple.   Skin:    General: Skin is warm and dry.   Neurological:     General: No focal deficit present.     Mental Status: She is alert.   Psychiatric:        Mood and Affect: Mood normal.     ED Results / Procedures / Treatments   EKG None  Procedures .Critical Care  Performed by: Roselyn Carlin NOVAK, MD Authorized by: Roselyn Carlin NOVAK, MD   Critical care provider statement:    Critical care time (minutes):  60   Critical care time was exclusive of:  Separately billable procedures and treating other  patients   Critical care was necessary to treat or prevent imminent or life-threatening deterioration of the following conditions: airway.   Critical care was time spent personally by me on the following activities:  Development of treatment plan with patient or surrogate, discussions with consultants, evaluation of patient's response to treatment, examination of patient, ordering and review of laboratory studies, ordering and review of radiographic studies, ordering and performing treatments and interventions, pulse oximetry, re-evaluation of patient's condition and review of old charts   Care discussed with: admitting provider     Medications Ordered in the ED Medications  diphenhydrAMINE (BENADRYL) injection 25 mg (25 mg Intravenous Given 11/16/23 0153)  famotidine (PEPCID) IVPB 20 mg premix (20 mg Intravenous New Bag/Given 11/16/23 0156)  methylPREDNISolone sodium succinate (SOLU-MEDROL) 125 mg/2 mL injection 125 mg (125 mg Intravenous Given 11/16/23 0155)    Initial Impression and Plan  Patient here with angioedema of tongue, likely from ACE. Will give a dose of steroids, benadryl and pepcid for now. Given age and history of PVD, will avoid epinephrine as less likely to be effective in this case and may potentially cause cardiovascular complications.   ED Course   Clinical Course as of 11/16/23 0713  Tue Nov 16, 2023  0226 CBC with mild leukocytosis. BMP is unremarkable.  [CS]  [id number] Patient resting comfortably. No change in angioedema. Airway remains intact.  [CS]  0601 Patient still with no improvement in swelling. Will plan admission for further monitoring. Hospitalist paged.  [CS]  [phone] Care of the patient signed out pending return call from Hospitalist.  [CS]    Clinical Course User Index [CS] Roselyn Carlin NOVAK, MD     MDM Rules/Calculators/A&P Medical Decision Making Problems Addressed: Angioedema of tongue: acute illness or injury that poses a threat to life or bodily  functions  Amount and/or Complexity of Data Reviewed Labs: ordered. Decision-making details documented in ED Course.  Risk Prescription drug management. Decision regarding hospitalization.     Final Clinical Impression(s) / ED Diagnoses Final diagnoses:  Angioedema of tongue    Rx / DC Orders ED Discharge Orders     None        Roselyn Carlin NOVAK, MD 11/16/23 [phone]

## 2023-11-17 DIAGNOSIS — T783XXA Angioneurotic edema, initial encounter: Secondary | ICD-10-CM | POA: Diagnosis not present

## 2023-11-17 LAB — BASIC METABOLIC PANEL WITH GFR
Anion gap: 10 (ref 5–15)
BUN: 23 mg/dL (ref 8–23)
CO2: 25 mmol/L (ref 22–32)
Calcium: 9.2 mg/dL (ref 8.9–10.3)
Chloride: 102 mmol/L (ref 98–111)
Creatinine, Ser: 0.77 mg/dL (ref 0.44–1.00)
GFR, Estimated: >60 mL/min (ref >60)
Glucose, Bld: 122 mg/dL — ABNORMAL HIGH (ref 70–99)
Potassium: 2.7 mmol/L — CL (ref 3.5–5.1)
Sodium: 137 mmol/L (ref 135–145)

## 2023-11-17 LAB — CBC
HCT: 40.5 % (ref 36.0–46.0)
Hemoglobin: 12.6 g/dL (ref 12.0–15.0)
MCH: 23.5 pg — ABNORMAL LOW (ref 26.0–34.0)
MCHC: 31.1 g/dL (ref 30.0–36.0)
MCV: 75.6 fL — ABNORMAL LOW (ref 80.0–100.0)
Platelets: 236 10*3/uL (ref 150–400)
RBC: 5.36 MIL/uL — ABNORMAL HIGH (ref 3.87–5.11)
RDW: 16.1 % — ABNORMAL HIGH (ref 11.5–15.5)
WBC: 14.2 10*3/uL — ABNORMAL HIGH (ref 4.0–10.5)
nRBC: 0 % (ref 0.0–0.2)

## 2023-11-17 LAB — HEMOGLOBIN A1C
Hgb A1c MFr Bld: 6.4 % — ABNORMAL HIGH (ref 4.8–5.6)
Mean Plasma Glucose: 136.98 mg/dL

## 2023-11-17 LAB — GLUCOSE, CAPILLARY: Glucose-Capillary: 112 mg/dL — ABNORMAL HIGH (ref 70–99)

## 2023-11-17 MED ORDER — POTASSIUM CHLORIDE CRYS ER 20 MEQ PO TBCR
40.0000 meq | EXTENDED_RELEASE_TABLET | Freq: Two times a day (BID) | ORAL | Status: DC
  Administered 2023-11-17: 40 meq via ORAL
  Filled 2023-11-17: qty 2

## 2023-11-17 MED ORDER — PHENOL 1.4 % MT LIQD: 1.0000 | OROMUCOSAL | Status: DC | PRN

## 2023-11-17 MED ORDER — POTASSIUM CHLORIDE 10 MEQ/100ML IV SOLN
10.0000 meq | INTRAVENOUS | Status: AC
  Administered 2023-11-17 (×2): 10 meq via INTRAVENOUS
  Filled 2023-11-17 (×2): qty 100

## 2023-11-17 MED ORDER — PREDNISONE 20 MG PO TABS: 20.0000 mg | ORAL_TABLET | Freq: Every day | ORAL | 0 refills | Status: AC

## 2023-11-17 MED ORDER — POTASSIUM CHLORIDE CRYS ER 20 MEQ PO TBCR: 20.0000 meq | EXTENDED_RELEASE_TABLET | Freq: Every day | ORAL | 0 refills | Status: AC

## 2023-11-17 NOTE — Progress Notes (Signed)
 Potassium down to 2.7 this morning, attending provider aware, pt is asymptomatic, will continue to monitor. Thanks!

## 2023-11-17 NOTE — Discharge Summary (Signed)
 Physician Discharge Summary  Heather Hart FMW:[id number] DOB: 1950-05-31 DOA: 11/16/2023  PCP: Elliot Charm, MD  Admit date: 11/16/2023 Discharge date: 11/17/2023  Admitted From: Home Discharge disposition: Home  Recommendations at discharge:  Take prednisone for 3 days along with Benadryl as needed. For low potassium, take potassium 20 mEq daily for the next 3 days. Benazepril has been stopped. Currently your blood pressure is controlled on nifedipine  only. At discharge, I would continue nifedipine  only.  Recommend to monitor blood pressure at home.  If systolic blood pressure exceeds 140, can resume HCTZ 25 mg daily.  If blood pressure exceeds 170, can resume hydralazine 25 mg twice daily like before.   Follow-up with PCP as an outpatient for further adjustment of blood pressure medicines as well as repeat potassium level.  Brief narrative:  Heather Hart is a 73 y.o. female with PMH significant for DM2 on Ozempic, HTN, HLD, PAD.  Around 1030 last night, patient noticed some raised rashes on her arms.  It was soon followed by feeling of numbness around her lips and tongue swelling.  She was having hard time articulating words.  Did not have trouble swallowing.  Symptoms did not subside with Benadryl in about 2 hours and felt that her tongue was swelling more and hence she presented to ED at [facility] last night. Of note, she is on benazepril for blood pressure.  She has tolerated it for several years.  Of note, she has allergy to Macrobid in the past but never had allergy to any ACE inhibitors  In the ED, she was afebrile, hemodynamically stable. Labs with WC count 13.7, BUN/creatinine 28/1.4, potassium low at 3.3 She was given IV Benadryl, IV Pepcid, IV Solu-Medrol. After about 6 hours in the ED, patient was still noted to have tongue swelling. Decision was made to transfer her to [facility] for observation under [facility].   Subjective: Patient was seen and  examined this morning. Pleasant.  Lying on bed.  Not in distress.  Family at bedside. Patient still had some numbness around her lips but overall improving. Blood pressure has been running in normal range since admission.   Assessment/Plan: Angioedema Started initially with raised rashes on her arms and later numbness and swelling of lips and tongue. Could be due to benazepril.  No other suspected etiology at this time. In the ED, she was given IV Solu-Medrol, IV Benadryl and IV Pepcid in the ED.   At the time of admission yesterday, she continued to have some swelling.  Hence she was continued on IV Solu-Medrol and Benadryl.  This morning, she still has some numbness but swelling seems to have significantly improved.   I would continue prednisone for the next 3 days with as needed Benadryl.  Hypokalemia  Potassium level is low at 2.7 today.  Unclear cause.  No evidence of fluid loss.  Given IV and oral replacement.   At discharge, I would start potassium 20 mEq daily for the next 3 days. Follow-up with PCP as an outpatient for repeat labs. Recent Labs  Lab 11/16/23 0152 11/17/23 0514  K 3.3* 2.7*   Type 2 diabetes mellitus Last A1c 7 on 2024  on Ozempic weekly for several months  Hypertension PTA meds- benazepril, hydralazine 25 mg twice daily, nifedipine  90 mg daily,  HCTZ 25 mg daily PRN Benazepril has been stopped. Currently she is on nifedipine  only.  Blood pressure has been running in normal range.   At discharge, I would continue nifedipine  only.  Recommend to monitor blood pressure at home. If systolic blood pressure exceeds 140, can resume HCTZ 25 mg daily.  If blood pressure exceeds 170, can resume hydralazine 25 mg twice daily like before.   Follow-up with PCP as an outpatient for further adjustment.  PAD, HLD PTA meds- aspirin.  Not on a statin.  Anxiety Xanax as needed  Mobility: Encourage ambulation  Goals of care:   Code Status: Full Code   Diet:  Diet  Order             Diet general           Diet regular Room service appropriate? Yes; Fluid consistency: Thin  Diet effective now                   Nutritional status:  Body mass index is 26.45 kg/m.       Wounds:  -    Discharge Exam:   Vitals:   11/16/23 1438 11/16/23 1958 11/16/23 2359 11/17/23 0409  BP: 131/64 131/64 (!) 123/59 119/62  Pulse: 72 66 67 64  Resp: 17 19 16 18   Temp: 98.4 F (36.9 C) 98 F (36.7 C) 97.9 F (36.6 C) 97.8 F (36.6 C)  TempSrc: Oral Oral Oral Oral  SpO2: 99% 98% 99% 100%  Weight:        Body mass index is 26.45 kg/m.  General exam: Pleasant, not in distress Skin: No rashes, lesions or ulcers. HEENT: Atraumatic, normocephalic, no obvious bleeding.  No visible swelling around the lips or tongue.  Voice clear Lungs: Clear to auscultation bilaterally,  CVS: S1, S2, no murmur,   GI/Abd: Soft, nontender, nondistended, bowel sound present,   CNS: Alert, awake, oriented x 3 Psychiatry: Mood appropriate,  Extremities: No pedal edema, no calf tenderness,   Follow ups:    Follow-up Information     Elliot Charm, MD Follow up.   Specialty: Internal Medicine Contact information: [address] [place] [address] [phone]                 Discharge Instructions:   Discharge Instructions     Call MD for:  difficulty breathing, headache or visual disturbances   Complete by: As directed    Call MD for:  extreme fatigue   Complete by: As directed    Call MD for:  hives   Complete by: As directed    Call MD for:  persistant dizziness or light-headedness   Complete by: As directed    Call MD for:  persistant nausea and vomiting   Complete by: As directed    Call MD for:  severe uncontrolled pain   Complete by: As directed    Call MD for:  temperature >100.4   Complete by: As directed    Diet general   Complete by: As directed    Discharge instructions   Complete by: As directed     Recommendations at discharge:   Take prednisone for 3 days along with Benadryl as needed.  For low potassium, take potassium 20 mEq daily for the next 3 days.  Benazepril has been stopped.  Currently your blood pressure is controlled on nifedipine  only. At discharge, I would continue nifedipine  only.  Recommend to monitor blood pressure at home.  If systolic blood pressure exceeds 140, can resume HCTZ 25 mg daily.  If blood pressure exceeds 170, can resume hydralazine 25 mg twice daily like before.    Follow-up with PCP as an outpatient  for further adjustment of blood pressure medicines as well as repeat potassium level.  General discharge instructions: Follow with Primary MD Elliot Charm, MD in 7 days  Please request your PCP  to go over your hospital tests, procedures, radiology results at the follow up. Please get your medicines reviewed and adjusted.  Your PCP may decide to repeat certain labs or tests as needed. Do not drive, operate heavy machinery, perform activities at heights, swimming or participation in water activities or provide baby sitting services if your were admitted for syncope or siezures until you have seen by Primary MD or a Neurologist and advised to do so again. [place]  Controlled Substance Reporting System database was reviewed. Do not drive, operate heavy machinery, perform activities at heights, swim, participate in water activities or provide baby-sitting services while on medications for pain, sleep and mood until your outpatient physician has reevaluated you and advised to do so again.  You are strongly recommended to comply with the dose, frequency and duration of prescribed medications. Activity: As tolerated with Full fall precautions use walker/cane & assistance as needed Avoid using any recreational substances like cigarette, tobacco, alcohol, or non-prescribed drug. If you experience worsening of your admission symptoms, develop shortness of  breath, life threatening emergency, suicidal or homicidal thoughts you must seek medical attention immediately by calling 911 or calling your MD immediately  if symptoms less severe. You must read complete instructions/literature along with all the possible adverse reactions/side effects for all the medicines you take and that have been prescribed to you. Take any new medicine only after you have completely understood and accepted all the possible adverse reactions/side effects.  Wear Seat belts while driving. You were cared for by a hospitalist during your hospital stay. If you have any questions about your discharge medications or the care you received while you were in the hospital after you are discharged, you can call the unit and ask to speak with the hospitalist or the covering physician. Once you are discharged, your primary care physician will handle any further medical issues. Please note that NO REFILLS for any discharge medications will be authorized once you are discharged, as it is imperative that you return to your primary care physician (or establish a relationship with a primary care physician if you do not have one).   Increase activity slowly   Complete by: As directed        Discharge Medications:   Allergies as of 11/17/2023       Reactions   Lisinopril Swelling   Nitrofurantoin Hives        Medication List     STOP taking these medications    benazepril 40 MG tablet Commonly known as: LOTENSIN       TAKE these medications    ALPRAZolam 1 MG tablet Commonly known as: XANAX Take 1 mg by mouth at bedtime as needed.   aspirin EC 81 MG tablet Take 81 mg by mouth daily.   Calcium + D 600-200 MG-UNIT per tablet Take 1 tablet by mouth 2 (two) times daily.   diphenhydrAMINE 25 MG tablet Commonly known as: SOMINEX Take 50 mg by mouth as needed for sleep.   hydrALAZINE 25 MG tablet Commonly known as: APRESOLINE Take 25 mg by mouth in the morning and at  bedtime.   hydrochlorothiazide 25 MG tablet Commonly known as: HYDRODIURIL Take 25 mg by mouth daily.   HYDROcodone -acetaminophen  5-325 MG tablet Commonly known as: NORCO/VICODIN Take 1 tablet by mouth every  4 (four) hours as needed.   MULTIVITAMIN ADULT PO Take 1 tablet by mouth daily.   naproxen  500 MG tablet Commonly known as: NAPROSYN  Take 1 tablet (500 mg total) by mouth 2 (two) times daily.   NIFEdipine  90 MG 24 hr tablet Commonly known as: PROCARDIA  XL/NIFEDICAL-XL Take 1 tablet (90 mg total) by mouth daily.   OneTouch Ultra test strip Generic drug: glucose blood daily. as directed   potassium chloride SA 20 MEQ tablet Commonly known as: KLOR-CON M Take 1 tablet (20 mEq total) by mouth daily for 3 days.   predniSONE 20 MG tablet Commonly known as: DELTASONE Take 1 tablet (20 mg total) by mouth daily with breakfast for 3 days.         The results of significant diagnostics from this hospitalization (including imaging, microbiology, ancillary and laboratory) are listed below for reference.    Procedures and Diagnostic Studies:   No results found.   Labs:   Basic Metabolic Panel: Recent Labs  Lab 11/16/23 0152 11/17/23 0514  NA 140 137  K 3.3* 2.7*  CL 102 102  CO2 24 25  GLUCOSE 109* 122*  BUN 28* 23  CREATININE 1.14* 0.77  CALCIUM 10.0 9.2   GFR Estimated Creatinine Clearance: 55.1 mL/min (by C-G formula based on SCr of 0.77 mg/dL). Liver Function Tests: No results for input(s): AST, ALT, ALKPHOS, BILITOT, PROT, ALBUMIN in the last 168 hours. No results for input(s): LIPASE, AMYLASE in the last 168 hours. No results for input(s): AMMONIA in the last 168 hours. Coagulation profile No results for input(s): INR, PROTIME in the last 168 hours.  CBC: Recent Labs  Lab 11/16/23 0152 11/17/23 0514  WBC 13.7* 14.2*  NEUTROABS 7.3  --   HGB 13.3 12.6  HCT 41.1 40.5  MCV 73.5* 75.6*  PLT 258 236   Cardiac Enzymes: No  results for input(s): CKTOTAL, CKMB, CKMBINDEX, TROPONINI in the last 168 hours. BNP: Invalid input(s): POCBNP CBG: Recent Labs  Lab 11/16/23 2112 11/17/23 0735  GLUCAP 149* 112*   D-Dimer No results for input(s): DDIMER in the last 72 hours. Hgb A1c Recent Labs    11/17/23 0514  HGBA1C 6.4*   Lipid Profile No results for input(s): CHOL, HDL, LDLCALC, TRIG, CHOLHDL, LDLDIRECT in the last 72 hours. Thyroid function studies No results for input(s): TSH, T4TOTAL, T3FREE, THYROIDAB in the last 72 hours.  Invalid input(s): FREET3 Anemia work up No results for input(s): VITAMINB12, FOLATE, FERRITIN, TIBC, IRON, RETICCTPCT in the last 72 hours. Microbiology No results found for this or any previous visit (from the past 240 hours).  Time coordinating discharge: 45 minutes  Signed: Jimel Myler  [facility] 11/17/2023, 10:32 AM

## 2023-11-17 NOTE — Progress Notes (Signed)
 Mobility Specialist - Progress Note   11/17/23 1010  Mobility  Activity Ambulated independently in hallway  Level of Assistance Independent  Assistive Device None  Distance Ambulated (ft) 500 ft  Activity Response Tolerated well  Mobility Referral Yes  Mobility visit 1 Mobility  Mobility Specialist Start Time (ACUTE ONLY) 1003  Mobility Specialist Stop Time (ACUTE ONLY) 1010  Mobility Specialist Time Calculation (min) (ACUTE ONLY) 7 min   Pt received in bed and agreeable to mobility. No complaints during session. Pt to EOB after session with all needs met.    [facility]

## 2023-11-17 NOTE — TOC Transition Note (Signed)
 Transition of Care [facility]) - Discharge Note   Patient Details  Name: Heather Hart MRN: [id number] Date of Birth: 01-Aug-1950  Transition of Care [facility] CM/SW Contact:  Bascom Service, RN Phone Number: 11/17/2023, 10:52 AM   Clinical Narrative:  d/c home no CM needs.     Final next level of care: Home/Self Care Barriers to Discharge: No Barriers Identified   Patient Goals and CMS Choice Patient states their goals for this hospitalization and ongoing recovery are:: Home CMS Medicare.gov Compare Post Acute Care list provided to:: Patient Choice offered to / list presented to : Patient [place] ownership interest in [facility].provided to:: Patient    Discharge Placement                       Discharge Plan and Services Additional resources added to the After Visit Summary for     Discharge Planning Services: CM Consult                                 Social Drivers of Health (SDOH) Interventions SDOH Screenings   Food Insecurity: No Food Insecurity (11/16/2023)  Housing: Low Risk  (11/16/2023)  Transportation Needs: No Transportation Needs (11/16/2023)  Utilities: Not At Risk (11/16/2023)  Social Connections: Moderately Integrated (11/16/2023)  Tobacco Use: Medium Risk (11/16/2023)     Readmission Risk Interventions     No data to display

## 2023-11-17 NOTE — Care Management Obs Status (Signed)
 MEDICARE OBSERVATION STATUS NOTIFICATION   Patient Details  Name: Heather Hart MRN: [id number] Date of Birth: 1950-06-01   Medicare Observation Status Notification Given:  Yes    MahabirNathanel, RN 11/17/2023, 10:08 AM

## 2023-11-17 NOTE — TOC Initial Note (Signed)
 Transition of Care [facility]) - Initial/Assessment Note    Patient Details  Name: Heather Hart MRN: [id number] Date of Birth: Feb 03, 1951  Transition of Care [facility] CM/SW Contact:    Bascom Service, RN Phone Number: 11/17/2023, 10:16 AM  Clinical Narrative:Has PCP; spoke w/ pt   lives at home;  plans to return at d/c; pt identified POC-spouse; Has own transportation; pt verified insurance; she denied SDOH risks; no DME @ home. d/c plan home.                                      Expected Discharge Plan: Home/Self Care Barriers to Discharge: Continued Medical Work up   Patient Goals and CMS Choice Patient states their goals for this hospitalization and ongoing recovery are:: Home CMS Medicare.gov Compare Post Acute Care list provided to:: Patient Choice offered to / list presented to : Patient [place] ownership interest in [facility].provided to:: Patient    Expected Discharge Plan and Services   Discharge Planning Services: CM Consult   Living arrangements for the past 2 months: Single Family Home                                      Prior Living Arrangements/Services Living arrangements for the past 2 months: Single Family Home Lives with:: Spouse   Do you feel safe going back to the place where you live?: Yes               Activities of Daily Living   ADL Screening (condition at time of admission) Independently performs ADLs?: Yes (appropriate for developmental age) Is the patient deaf or have difficulty hearing?: No Does the patient have difficulty seeing, even when wearing glasses/contacts?: No Does the patient have difficulty concentrating, remembering, or making decisions?: No  Permission Sought/Granted Permission sought to share information with : Case Manager Permission granted to share information with : Yes, Verbal Permission Granted              Emotional Assessment              Admission diagnosis:  Angio-edema,  initial encounter [T78.3XXA] Angioedema of tongue [T78.3XXA] Patient Active Problem List   Diagnosis Date Noted   Angio-edema, initial encounter 11/16/2023   Essential hypertension 11/16/2014   Hyperlipemia 11/16/2014   Diabetes mellitus (HCC) 11/16/2014   PAD (peripheral artery disease) (HCC) 05/24/2013   Aftercare following surgery of the circulatory system, NEC 05/26/2012   Peripheral vascular disease, unspecified (HCC) 05/26/2012   PCP:  Elliot Charm, MD Pharmacy:   [id number] - [place], [place] - [address] AT [facility] OF [address] [place] [address] Phone: [phone] Fax: [phone]     Social Drivers of Health (SDOH) Social History: SDOH Screenings   Food Insecurity: No Food Insecurity (11/16/2023)  Housing: Low Risk  (11/16/2023)  Transportation Needs: No Transportation Needs (11/16/2023)  Utilities: Not At Risk (11/16/2023)  Social Connections: Moderately Integrated (11/16/2023)  Tobacco Use: Medium Risk (11/16/2023)   SDOH Interventions:     Readmission Risk Interventions     No data to display

## 2023-11-19 DIAGNOSIS — I739 Peripheral vascular disease, unspecified: Secondary | ICD-10-CM | POA: Diagnosis not present

## 2023-11-19 DIAGNOSIS — E876 Hypokalemia: Secondary | ICD-10-CM | POA: Diagnosis not present

## 2023-11-19 DIAGNOSIS — E1151 Type 2 diabetes mellitus with diabetic peripheral angiopathy without gangrene: Secondary | ICD-10-CM | POA: Diagnosis not present

## 2023-11-19 DIAGNOSIS — I10 Essential (primary) hypertension: Secondary | ICD-10-CM | POA: Diagnosis not present

## 2023-11-19 DIAGNOSIS — T783XXA Angioneurotic edema, initial encounter: Secondary | ICD-10-CM | POA: Diagnosis not present

## 2023-11-19 DIAGNOSIS — J45901 Unspecified asthma with (acute) exacerbation: Secondary | ICD-10-CM | POA: Diagnosis not present

## 2023-12-03 ENCOUNTER — Telehealth: Payer: Self-pay | Admitting: Pharmacist

## 2023-12-03 NOTE — Progress Notes (Signed)
   12/03/2023  Patient ID: Heather Hart, female   DOB: 29-Jun-1950, 73 y.o.   MRN: [id number]  Patient appeared on insurance report for at-risk for failing 2025 metric: Medication Adherence for Diabetes (MAD)   Medication: Ozempic 1mg  Last fill date: 10/20/23 28DS  Fail date: 10 days from today (calculated)  Note on 6/27 from PCP stated that patient's A1c looked good and she could discontinue the Ozempic 1mg  if she wanted. Need to call and see if patient plans to proceed with medication.  If not continuing or picking up again, will fail the metric in 10 days.  Call patient to discuss.   Update from 7/14:   Called and spoke with the patient on the phone today. She reports stopping the Ozempic per last visit note- therefore will fail the metric for 2025. Med list updated. Also verified BP meds. Said she stopped the Benazepril and HCTZ too. Advised she was NOT told to stop the HCTZ. Recent BP's with nifedipine  only have been: 145/76, 143/78, 144/73, 149/79. Advised we need the top number to be <140 at minimum. Recommend she restarts the HCTZ. Has a bottle on-hand and will restart today.   Aloysius Lewis, PharmD [facility]  Phone Number: [phone]

## 2024-01-11 DIAGNOSIS — E1151 Type 2 diabetes mellitus with diabetic peripheral angiopathy without gangrene: Secondary | ICD-10-CM | POA: Diagnosis not present

## 2024-01-11 DIAGNOSIS — I745 Embolism and thrombosis of iliac artery: Secondary | ICD-10-CM | POA: Diagnosis not present

## 2024-01-11 DIAGNOSIS — Z1331 Encounter for screening for depression: Secondary | ICD-10-CM | POA: Diagnosis not present

## 2024-01-11 DIAGNOSIS — Z136 Encounter for screening for cardiovascular disorders: Secondary | ICD-10-CM | POA: Diagnosis not present

## 2024-01-11 DIAGNOSIS — F3341 Major depressive disorder, recurrent, in partial remission: Secondary | ICD-10-CM | POA: Diagnosis not present

## 2024-01-11 DIAGNOSIS — I739 Peripheral vascular disease, unspecified: Secondary | ICD-10-CM | POA: Diagnosis not present

## 2024-01-11 DIAGNOSIS — Z0000 Encounter for general adult medical examination without abnormal findings: Secondary | ICD-10-CM | POA: Diagnosis not present

## 2024-01-11 DIAGNOSIS — D72829 Elevated white blood cell count, unspecified: Secondary | ICD-10-CM | POA: Diagnosis not present

## 2024-01-12 DIAGNOSIS — M1990 Unspecified osteoarthritis, unspecified site: Secondary | ICD-10-CM | POA: Diagnosis not present

## 2024-01-12 DIAGNOSIS — D72829 Elevated white blood cell count, unspecified: Secondary | ICD-10-CM | POA: Diagnosis not present

## 2024-01-12 DIAGNOSIS — I739 Peripheral vascular disease, unspecified: Secondary | ICD-10-CM | POA: Diagnosis not present

## 2024-02-01 DIAGNOSIS — I739 Peripheral vascular disease, unspecified: Secondary | ICD-10-CM | POA: Diagnosis not present

## 2024-02-09 DIAGNOSIS — I10 Essential (primary) hypertension: Secondary | ICD-10-CM | POA: Diagnosis not present

## 2024-02-09 DIAGNOSIS — E1151 Type 2 diabetes mellitus with diabetic peripheral angiopathy without gangrene: Secondary | ICD-10-CM | POA: Diagnosis not present

## 2024-02-09 DIAGNOSIS — E785 Hyperlipidemia, unspecified: Secondary | ICD-10-CM | POA: Diagnosis not present

## 2024-02-15 DIAGNOSIS — I7409 Other arterial embolism and thrombosis of abdominal aorta: Secondary | ICD-10-CM | POA: Diagnosis not present

## 2024-03-07 DIAGNOSIS — J45909 Unspecified asthma, uncomplicated: Secondary | ICD-10-CM | POA: Diagnosis not present

## 2024-03-07 DIAGNOSIS — E785 Hyperlipidemia, unspecified: Secondary | ICD-10-CM | POA: Diagnosis not present

## 2024-03-07 DIAGNOSIS — F419 Anxiety disorder, unspecified: Secondary | ICD-10-CM | POA: Diagnosis not present

## 2024-03-07 DIAGNOSIS — E1122 Type 2 diabetes mellitus with diabetic chronic kidney disease: Secondary | ICD-10-CM | POA: Diagnosis not present

## 2024-03-07 DIAGNOSIS — F329 Major depressive disorder, single episode, unspecified: Secondary | ICD-10-CM | POA: Diagnosis not present

## 2024-03-07 DIAGNOSIS — M1990 Unspecified osteoarthritis, unspecified site: Secondary | ICD-10-CM | POA: Diagnosis not present

## 2024-03-07 DIAGNOSIS — Z87891 Personal history of nicotine dependence: Secondary | ICD-10-CM | POA: Diagnosis not present

## 2024-03-07 DIAGNOSIS — N1831 Chronic kidney disease, stage 3a: Secondary | ICD-10-CM | POA: Diagnosis not present

## 2024-03-07 DIAGNOSIS — M62838 Other muscle spasm: Secondary | ICD-10-CM | POA: Diagnosis not present

## 2024-04-12 DIAGNOSIS — I739 Peripheral vascular disease, unspecified: Secondary | ICD-10-CM | POA: Diagnosis not present

## 2024-04-12 DIAGNOSIS — I10 Essential (primary) hypertension: Secondary | ICD-10-CM | POA: Diagnosis not present

## 2024-04-12 DIAGNOSIS — E785 Hyperlipidemia, unspecified: Secondary | ICD-10-CM | POA: Diagnosis not present

## 2024-04-12 DIAGNOSIS — M25551 Pain in right hip: Secondary | ICD-10-CM | POA: Diagnosis not present

## 2024-04-12 DIAGNOSIS — E1151 Type 2 diabetes mellitus with diabetic peripheral angiopathy without gangrene: Secondary | ICD-10-CM | POA: Diagnosis not present

## 2024-06-13 ENCOUNTER — Other Ambulatory Visit: Payer: Self-pay | Admitting: Internal Medicine

## 2024-06-13 DIAGNOSIS — Z1231 Encounter for screening mammogram for malignant neoplasm of breast: Secondary | ICD-10-CM

## 2024-06-20 ENCOUNTER — Ambulatory Visit
Admission: RE | Admit: 2024-06-20 | Discharge: 2024-06-20 | Disposition: A | Source: Ambulatory Visit | Attending: Internal Medicine | Admitting: Internal Medicine

## 2024-06-20 DIAGNOSIS — Z1231 Encounter for screening mammogram for malignant neoplasm of breast: Secondary | ICD-10-CM
